# Patient Record
Sex: Female | Born: 1990 | Race: Black or African American | Hispanic: No | Marital: Single | Smoking: Never smoker
Health system: Southern US, Community
[De-identification: ages and names within clinical notes are randomized; demographics above are authoritative.]

## PROBLEM LIST (undated history)

## (undated) DIAGNOSIS — F419 Anxiety disorder, unspecified: Secondary | ICD-10-CM

## (undated) DIAGNOSIS — IMO0002 Reserved for concepts with insufficient information to code with codable children: Secondary | ICD-10-CM

## (undated) DIAGNOSIS — R42 Dizziness and giddiness: Secondary | ICD-10-CM

## (undated) DIAGNOSIS — F329 Major depressive disorder, single episode, unspecified: Secondary | ICD-10-CM

## (undated) DIAGNOSIS — K5792 Diverticulitis of intestine, part unspecified, without perforation or abscess without bleeding: Secondary | ICD-10-CM

## (undated) DIAGNOSIS — E079 Disorder of thyroid, unspecified: Secondary | ICD-10-CM

## (undated) DIAGNOSIS — Z8742 Personal history of other diseases of the female genital tract: Secondary | ICD-10-CM

## (undated) DIAGNOSIS — T7840XA Allergy, unspecified, initial encounter: Secondary | ICD-10-CM

## (undated) DIAGNOSIS — D649 Anemia, unspecified: Secondary | ICD-10-CM

## (undated) DIAGNOSIS — D259 Leiomyoma of uterus, unspecified: Secondary | ICD-10-CM

## (undated) DIAGNOSIS — R51 Headache: Secondary | ICD-10-CM

## (undated) DIAGNOSIS — E039 Hypothyroidism, unspecified: Secondary | ICD-10-CM

## (undated) DIAGNOSIS — S0300XA Dislocation of jaw, unspecified side, initial encounter: Secondary | ICD-10-CM

## (undated) DIAGNOSIS — D219 Benign neoplasm of connective and other soft tissue, unspecified: Secondary | ICD-10-CM

## (undated) DIAGNOSIS — F32A Depression, unspecified: Secondary | ICD-10-CM

## (undated) HISTORY — DX: Anxiety disorder, unspecified: F41.9

## (undated) HISTORY — PX: WISDOM TOOTH EXTRACTION: SHX21

## (undated) HISTORY — DX: Allergy, unspecified, initial encounter: T78.40XA

## (undated) HISTORY — DX: Headache: R51

## (undated) HISTORY — DX: Dizziness and giddiness: R42

## (undated) HISTORY — DX: Anemia, unspecified: D64.9

## (undated) HISTORY — DX: Dislocation of jaw, unspecified side, initial encounter: S03.00XA

## (undated) HISTORY — DX: Hypothyroidism, unspecified: E03.9

## (undated) HISTORY — DX: Personal history of other diseases of the female genital tract: Z87.42

## (undated) HISTORY — DX: Diverticulitis of intestine, part unspecified, without perforation or abscess without bleeding: K57.92

## (undated) HISTORY — DX: Leiomyoma of uterus, unspecified: D25.9

## (undated) HISTORY — DX: Reserved for concepts with insufficient information to code with codable children: IMO0002

## (undated) HISTORY — DX: Benign neoplasm of connective and other soft tissue, unspecified: D21.9

## (undated) HISTORY — PX: COLONOSCOPY: SHX174

---
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
## 2011-06-06 ENCOUNTER — Encounter: Payer: Self-pay | Admitting: Obstetrics and Gynecology

## 2011-10-26 ENCOUNTER — Encounter: Payer: Self-pay | Admitting: Obstetrics and Gynecology

## 2011-10-31 ENCOUNTER — Ambulatory Visit (INDEPENDENT_AMBULATORY_CARE_PROVIDER_SITE_OTHER): Payer: No Typology Code available for payment source | Admitting: Obstetrics and Gynecology

## 2011-10-31 ENCOUNTER — Encounter: Payer: Self-pay | Admitting: Obstetrics and Gynecology

## 2011-10-31 VITALS — BP 104/64 | Ht 65.0 in | Wt 163.0 lb

## 2011-10-31 DIAGNOSIS — E663 Overweight: Secondary | ICD-10-CM | POA: Insufficient documentation

## 2011-10-31 DIAGNOSIS — Z124 Encounter for screening for malignant neoplasm of cervix: Secondary | ICD-10-CM

## 2011-10-31 DIAGNOSIS — Z01419 Encounter for gynecological examination (general) (routine) without abnormal findings: Secondary | ICD-10-CM

## 2011-10-31 DIAGNOSIS — N920 Excessive and frequent menstruation with regular cycle: Secondary | ICD-10-CM

## 2011-10-31 MED ORDER — NORETHIN-ETH ESTRAD-FE BIPHAS 1 MG-10 MCG / 10 MCG PO TABS: 1.0000 | ORAL_TABLET | Freq: Every day | ORAL | Status: DC

## 2011-10-31 NOTE — Progress Notes (Signed)
Last Pap: 07/24/2007 WNL: Yes Regular Periods:no takes [facility] for cycle regulations Contraception: BC Pill  Monthly Breast exam:no Tetanus<30yrs:no Nl.Bladder Function:yes Daily BMs:no c/o constipation Healthy Diet:no Calcium:no Mammogram:no Date of Mammogram: n/a Exercise:no Have often Exercise: n/a Seatbelt: no Abuse at home: no Stressful work:no Sigmoid-colonoscopy: n/a Bone Density: No PCP: Dr. Durwin Nora Change in PMH: none Change in [facility]: none   ANNUAL GYNECOLOGIC EXAMINATION   Crystal Howell is a 21 y.o. female, G0P0, who presents for an annual exam. See above. The patient has a history of dysmenorrhea and menorrhagia.  She is better on birth control pills.  Her periods are slightly irregular.  She is not sexually active  Prior Hysterectomy: No    History   Social History  . Marital Status: Single    Spouse Name: N/A    Number of Children: N/A  . Years of Education: N/A   Social History Main Topics  . Smoking status: Never Smoker   . Smokeless tobacco: Never Used  . Alcohol Use: No  . Drug Use: No  . Sexually Active: Yes    Birth Control/ Protection: Pill   Other Topics Concern  . None   Social History Narrative  . None    Menstrual cycle:   LMP: Patient's last menstrual period was 09/20/2011.           Cycle: Regular, monthly with normal flow and no severe dysmenorrha  The following portions of the patient's history were reviewed and updated as appropriate: allergies, current medications, past family history, past medical history, past social history, past surgical history and problem list.  Review of Systems Pertinent items are noted in HPI. Breast:Negative for breast lump,nipple discharge or nipple retraction Gastrointestinal: Negative for abdominal pain, change in bowel habits or rectal bleeding Urinary:negative   Objective:    BP 104/64  Ht 5\' 5"  (1.651 m)  Wt 163 lb (73.936 kg)  BMI 27.12 kg/m2  LMP 09/20/2011    Weight:  Wt  Readings from Last 1 Encounters:  10/31/11 163 lb (73.936 kg)          BMI: Body mass index is 27.12 kg/(m^2).  General Appearance: Alert, appropriate appearance for age. No acute distress HEENT: Grossly normal Neck / Thyroid: Supple, no masses, nodes or enlargement Lungs: clear to auscultation bilaterally Back: No CVA tenderness Breast Exam: No masses or nodes.No dimpling, nipple retraction or discharge. Cardiovascular: Regular rate and rhythm. S1, S2, no murmur Gastrointestinal: Soft, non-tender, no masses or organomegaly  ++++++++++++++++++++++++++++++++++++++++++++++++++++++++  Pelvic Exam: External genitalia: normal general appearance Vaginal: normal without tenderness, induration or masses and relaxation: No Cervix: normal appearance Adnexa: normal bimanual exam Uterus: normal size, shape, and consistency Rectovaginal: not indicated  ++++++++++++++++++++++++++++++++++++++++++++++++++++++++  Lymphatic Exam: Non-palpable nodes in neck, clavicular, axillary, or inguinal regions Neurologic: Normal speech, no tremor  Psychiatric: Alert and oriented, appropriate affect.   Wet Prep:   not applicable Urinalysis:  not applicable UPT:           Not done   Assessment:    Normal gyn exam   Overweight or obese: Yes   Pelvic relaxation: No  Improved dysmenorrhea and menorrhagia on lolo Estrin OCPs.  Grieving her father's death   Plan:    pap smear return annually or prn Contraception:oral contraceptives (estrogen/progesterone)    Medications prescribed: LoLo Estrin  STD screen request: none  RPR: No.   HBsAg: No.  Hepatitis C: No.  The updated Pap smear screening guidelines were discussed with the patient. The patient  requested that I obtain a Pap smear: Yes.  Kegel exercises discussed: No.  Proper diet and regular exercise were reviewed.  Annual mammograms recommended starting at age 52. Proper breast care was discussed.  Regular health maintenance was  reviewed.  Mylinda Latina.D.

## 2011-11-05 LAB — PAP IG, CT-NG, RFX HPV ASCU: GC Probe Amp: NEGATIVE

## 2011-12-12 ENCOUNTER — Ambulatory Visit (INDEPENDENT_AMBULATORY_CARE_PROVIDER_SITE_OTHER): Payer: No Typology Code available for payment source | Admitting: Family Medicine

## 2011-12-12 VITALS — BP 108/64 | HR 72 | Temp 97.9°F | Resp 12 | Ht 66.0 in | Wt 164.0 lb

## 2011-12-12 DIAGNOSIS — R42 Dizziness and giddiness: Secondary | ICD-10-CM

## 2011-12-12 DIAGNOSIS — R51 Headache: Secondary | ICD-10-CM

## 2011-12-12 DIAGNOSIS — J309 Allergic rhinitis, unspecified: Secondary | ICD-10-CM | POA: Insufficient documentation

## 2011-12-12 LAB — COMPREHENSIVE METABOLIC PANEL
ALT: <8 U/L (ref 0–35)
AST: 14 U/L (ref 0–37)
Albumin: 4.6 g/dL (ref 3.5–5.2)
BUN: 9 mg/dL (ref 6–23)
Calcium: 9.5 mg/dL (ref 8.4–10.5)
Chloride: 107 mEq/L (ref 96–112)
Potassium: 4 mEq/L (ref 3.5–5.3)
Total Protein: 7.1 g/dL (ref 6.0–8.3)

## 2011-12-12 LAB — POCT CBC
Hemoglobin: 13.2 g/dL (ref 12.2–16.2)
MCH, POC: 29.8 pg (ref 27–31.2)
MPV: 8.3 fL (ref 0–99.8)
POC MID %: 6.3 %M (ref 0–12)
RBC: 4.43 M/uL (ref 4.04–5.48)
WBC: 4.7 10*3/uL (ref 4.6–10.2)

## 2011-12-12 MED ORDER — MECLIZINE HCL 25 MG PO TABS: ORAL_TABLET | ORAL | Status: DC

## 2011-12-12 NOTE — Progress Notes (Addendum)
Subjective: 21 year old lady with history of intermittent episodes of dizziness over the past several weeks. They may last from 10 minutes. She sometimes is lightheaded sometimes more spinning dizziness. She has not had any nausea or vomiting. No loss of coordination. Although she has headaches that is necessarily associated with the dizziness. She has had some mild episodes in the past, but this has been consistent run off these spells. She has them several times a day. She has not been taking medications for him.  Patient does have a history of allergic rhinitis for which she's on medicine. She's also oral contraceptives. 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 Healthy looking young lady. Her TMs are normal. Eyes PERRLA. Fundi benign. EOMs intact. Throat clear. Neck supple without nodes or thyromegaly. No carotid bruits. Chest clear. Heart regular murmurs. Finger to nose normal. Romberg negative. Tandem walk normal. Strength seems symmetrical.  Assessment: Vertigo  Plan: CBC, glucose, complete metabolic panel  Results for orders placed in visit on 12/12/11  POCT CBC      Component Value Range   WBC 4.7  4.6 - 10.2 K/uL   Lymph, poc 2.0  0.6 - 3.4   POC LYMPH PERCENT 42.0  10 - 50 %L   MID (cbc) 0.3  0 - 0.9   POC MID % 6.3  0 - 12 %M   POC Granulocyte 2.4  2 - 6.9   Granulocyte percent 51.7  37 - 80 %G   RBC 4.43  4.04 - 5.48 M/uL   Hemoglobin 13.2  12.2 - 16.2 g/dL   HCT, POC 78.2  95.6 - 47.9 %   MCV 96.4  80 - 97 fL   MCH, POC 29.8  27 - 31.2 pg   MCHC 30.9 (*) 31.8 - 35.4 g/dL   RDW, POC 21.3     Platelet Count, POC 304  142 - 424 K/uL   MPV 8.3  0 - 99.8 fL  GLUCOSE, POCT (MANUAL RESULT ENTRY)      Component Value Range   POC Glucose 73  70 - 99 mg/dl    Will treat with Antivert. Return if worse.  Patient incidentally added that she sometimes wakes up and her heart pounding hard. Advised her that if this continues to persist  she would need to come back in to be further assessed.

## 2011-12-12 NOTE — Patient Instructions (Addendum)
Vertigo Vertigo means you feel like you or your surroundings are moving when they are not. Vertigo can be dangerous if it occurs when you are at work, driving, or performing difficult activities.  CAUSES  Vertigo occurs when there is a conflict of signals sent to your brain from the visual and sensory systems in your body. There are many different causes of vertigo, including:  Infections, especially in the inner ear.   A bad reaction to a drug or misuse of alcohol and medicines.   Withdrawal from drugs or alcohol.   Rapidly changing positions, such as lying down or rolling over in bed.   A migraine headache.   Decreased blood flow to the brain.   Increased pressure in the brain from a head injury, infection, tumor, or bleeding.  SYMPTOMS  You may feel as though the world is spinning around or you are falling to the ground. Because your balance is upset, vertigo can cause nausea and vomiting. You may have involuntary eye movements (nystagmus). DIAGNOSIS  Vertigo is usually diagnosed by physical exam. If the cause of your vertigo is unknown, your caregiver may perform imaging tests, such as an MRI scan (magnetic resonance imaging). TREATMENT  Most cases of vertigo resolve on their own, without treatment. Depending on the cause, your caregiver may prescribe certain medicines. If your vertigo is related to body position issues, your caregiver may recommend movements or procedures to correct the problem. In rare cases, if your vertigo is caused by certain inner ear problems, you may need surgery. HOME CARE INSTRUCTIONS   Follow your caregiver's instructions.   Avoid driving.   Avoid operating heavy machinery.   Avoid performing any tasks that would be dangerous to you or others during a vertigo episode.   Tell your caregiver if you notice that certain medicines seem to be causing your vertigo. Some of the medicines used to treat vertigo episodes can actually make them worse in some  people.  SEEK IMMEDIATE MEDICAL CARE IF:   Your medicines do not relieve your vertigo or are making it worse.   You develop problems with talking, walking, weakness, or using your arms, hands, or legs.   You develop severe headaches.   Your nausea or vomiting continues or gets worse.   You develop visual changes.   A family member notices behavioral changes.   Your condition gets worse.  MAKE SURE YOU:  Understand these instructions.   Will watch your condition.   Will get help right away if you are not doing well or get worse.  Document Released: 01/03/2005 Document Revised: 03/15/2011 Document Reviewed: 10/12/2010 [facility] Patient Information 2012 [place], [place].   Take the antivert medicine when needed for dizzines

## 2011-12-12 NOTE — Addendum Note (Signed)
Addended by: HOPPER, DAVID H on: 12/12/2011 09:39 AM   Modules accepted: Level of Service

## 2011-12-14 NOTE — Progress Notes (Signed)
Quick Note:  Send patient a normal lab letter ______

## 2011-12-15 ENCOUNTER — Encounter: Payer: Self-pay | Admitting: Radiology

## 2012-04-07 ENCOUNTER — Telehealth: Payer: Self-pay | Admitting: Obstetrics and Gynecology

## 2012-04-07 NOTE — Telephone Encounter (Signed)
Tc to pt regarding message. Pt states she is on lo loestrin and has not had a cycle. Asked pt did she do a pregnancy test and pt states no. Pt want to come in to see AVS. Scheduled pt an appt with AVS on this week. Pt voiced understanding.

## 2012-04-07 NOTE — Telephone Encounter (Signed)
Lm for pt to call back

## 2012-04-16 ENCOUNTER — Ambulatory Visit (INDEPENDENT_AMBULATORY_CARE_PROVIDER_SITE_OTHER): Payer: No Typology Code available for payment source | Admitting: Obstetrics and Gynecology

## 2012-04-16 ENCOUNTER — Encounter: Payer: Self-pay | Admitting: Obstetrics and Gynecology

## 2012-04-16 VITALS — BP 102/56 | HR 74 | Wt 179.0 lb

## 2012-04-16 DIAGNOSIS — R300 Dysuria: Secondary | ICD-10-CM

## 2012-04-16 DIAGNOSIS — N912 Amenorrhea, unspecified: Secondary | ICD-10-CM

## 2012-04-16 LAB — POCT URINALYSIS DIPSTICK
Bilirubin, UA: NEGATIVE
Glucose, UA: NEGATIVE
Leukocytes, UA: NEGATIVE
Nitrite, UA: NEGATIVE
Urobilinogen, UA: NEGATIVE

## 2012-04-16 NOTE — Progress Notes (Signed)
Subjective:    Crystal Howell is a 22 y.o. female, G0P0, who presents for been on lo-lo loesrtin has not had a cycle this month. She also complains of dysuria in the morning. She is not sexually active.  The following portions of the patient's history were reviewed and updated as appropriate: allergies, current medications, past family history.  Review of Systems Pertinent items are noted in HPI. Breast:Negative for breast lump,nipple discharge or nipple retraction Gastrointestinal: Negative for abdominal pain, change in bowel habits or rectal bleeding Urinary:negative   Objective:    BP 102/56  Pulse 74  Wt 179 lb (81.194 kg)  LMP 03/06/2012    Weight:  Wt Readings from Last 1 Encounters:  04/16/12 179 lb (81.194 kg)          BMI: There is no height on file to calculate BMI.  General Appearance: Alert, appropriate appearance for age. No acute distress BAck: No CVA tenderness Gastrointestinal: Soft, non-tender, no masses or organomegaly Pelvic Exam: External genitalia: normal general appearance Vaginal: normal without tenderness, induration or masses Cervix: nontender Adnexa: normal bimanual exam Uterus: normal size, nontender Rectovaginal: not indicated  UA: negative    Assessment:   Amenorrhea secondary to birth control pills  Dysuria   Plan:    A long discussion was had with the patient about birth control and amenorrhea. The patient is not sexually active. She elects to continue Lo Loestrin for now.  We will have the patient increase her fluid intake as well as cranberry juice. If her dysuria continues then she will return for urine culture.  Return for annual exam.  Dr. Stefano Gaul

## 2012-10-20 ENCOUNTER — Ambulatory Visit (INDEPENDENT_AMBULATORY_CARE_PROVIDER_SITE_OTHER): Payer: No Typology Code available for payment source | Admitting: Family Medicine

## 2012-10-20 ENCOUNTER — Telehealth: Payer: Self-pay | Admitting: Radiology

## 2012-10-20 VITALS — BP 111/73 | HR 70 | Temp 97.8°F | Resp 16 | Ht 65.0 in | Wt 179.0 lb

## 2012-10-20 DIAGNOSIS — G935 Compression of brain: Secondary | ICD-10-CM

## 2012-10-20 DIAGNOSIS — R51 Headache: Secondary | ICD-10-CM

## 2012-10-20 DIAGNOSIS — R209 Unspecified disturbances of skin sensation: Secondary | ICD-10-CM

## 2012-10-20 LAB — POCT URINE PREGNANCY: Preg Test, Ur: NEGATIVE

## 2012-10-20 NOTE — Telephone Encounter (Signed)
I spoke to [place] at [facility] no precertication or notification is required for the MRI scan. I will call tomorrow to get this scheduled at [facility].

## 2012-10-20 NOTE — Telephone Encounter (Signed)
I need to call tomorrow about the scan, will get it at [facility], she had previous one there which shows a Chiari malformation, this was in 2010. Sandip Power

## 2012-10-20 NOTE — Progress Notes (Signed)
[facility] [address] [place] [address] [id number] [phone]  Date:  10/20/2012   Name:  Crystal Howell   DOB:  [birth year]   MRN:  [id number]  PCP:  Carver Fila, RN    Chief Complaint: Numbness and Headache   History of Present Illness:  Crystal Howell is a 22 y.o. very pleasant female patient who presents with the following:  She is here with numbness and weakness in her right hand since yesterday- she first noted it around noon yesterday.  She dropped a cup of water from her right hand yesterday.  The sx seem to come and go, but "has been pretty persistent."  She notes she has had this once in the past- a few months ago. It resolved.    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 She mainly notes the sx in her right index and long fingers.   The left hand is ok.    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  The MRI was ordered by her ophthalmologist, and she saw a neurologist but cannot recall his name at this time.  She also saw Dr. Mikal Plane- she was told that her malformation would be unlikely to cause her any problems.  She has not followed up since.  Also, she notes that she had a HA on Friday- it felt like "a bulging" in her right temple area.  This lasted about an hour, and resolved Friday evening. She no longer has a HA.    LMP was 09/18/12.  She is SA, uses OCP and condoms.   She is otherwise generally healthy    Patient Active Problem List   Diagnosis Date Noted  . Allergic rhinitis 12/12/2011  . Overweight 10/31/2011    Past Medical History  Diagnosis Date  . Hx of menorrhagia   . Allergy   . Anemia     Past Surgical History  Procedure Laterality Date  . Wisdom tooth extraction      History  Substance Use Topics  . Smoking status: Never Smoker   . Smokeless tobacco: Never Used  . Alcohol Use: No    Family History  Problem Relation  Age of Onset  . Thyroid disease Mother   . Anemia Mother   . Heart disease Maternal Grandmother   . Emphysema Maternal Grandmother   . Stroke Maternal Grandmother   . Mental illness Maternal Grandmother   . Lung cancer Maternal Grandfather   . Liver disease Father     Allergies  Allergen Reactions  . Doxycycline   . Erythromycin     Medication list has been reviewed and updated.  Current Outpatient Prescriptions on File Prior to Visit  Medication Sig Dispense Refill  . albuterol (PROVENTIL HFA;VENTOLIN HFA) 108 (90 BASE) MCG/ACT inhaler Inhale 2 puffs into the lungs every 6 (six) hours as needed.      . loratadine (CLARITIN) 10 MG tablet Take 10 mg by mouth daily.      . Norethindrone-Ethinyl Estradiol-Fe Biphas (LO LOESTRIN FE) 1 MG-10 MCG / 10 MCG tablet Take 1 tablet by mouth daily.  1 Package  11  . fluticasone (FLONASE) 50 MCG/ACT nasal spray Place 2 sprays into the nose daily.      . meclizine (ANTIVERT) 25 MG tablet Take one every 6 hours as needed for dizziness  30 tablet  1  .  norethindrone-ethinyl estradiol (JUNEL FE,GILDESS FE,LOESTRIN FE) 1-20 MG-MCG tablet Take 1 tablet by mouth daily.       No current facility-administered medications on file prior to visit.    Review of Systems:  As per HPI- otherwise negative.   Physical Examination: Filed Vitals:   10/20/12 0919  BP: 111/73  Pulse: 70  Temp: 97.8 F (36.6 C)  Resp: 16   Filed Vitals:   10/20/12 0919  Height: 5\' 5"  (1.651 m)  Weight: 179 lb (81.194 kg)   Body mass index is 29.79 kg/(m^2). Ideal Body Weight: Weight in (lb) to have BMI = 25: 149.9  GEN: WDWN, NAD, Non-toxic, A & O x 3, looks well HEENT: Atraumatic, Normocephalic. Neck supple. No masses, No LAD.  Bilateral TM wnl, oropharynx normal.  PEERL,EOMI.  No bruit.   Ears and Nose: No external deformity. CV: RRR, No M/G/R. No JVD. No thrill. No extra heart sounds. PULM: CTA B, no wheezes, crackles, rhonchi. No retractions. No resp.  distress. No accessory muscle use. ABD: S, NT, ND, +BS. No rebound. No HSM. EXTR: No c/c/e NEURO Normal gait. Normal neuro exam except as below- normal strength and DTR all extremities, normal facial movement. Negative romberg and tandem gait testing.  She does not note numbness in the right hand- this is present in the long and index fingers.  PSYCH: Normally interactive. Conversant. Not depressed or anxious appearing.  Calm demeanor.  Positive phalen test right hand  Requested old MRI scan- actually was done in 2010, performed at [facility].  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    Results for orders placed in visit on 10/20/12  POCT URINE PREGNANCY      Result Value Range   Preg Test, Ur Negative      Assessment and Plan: Headache(784.0) - Plan: POCT urine pregnancy  Chiari malformation type I - Plan: MR Brain Wo Contrast   Headache has now resolved.  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  Placed in a velcro wrist splint.   Allowed her to go home while I contacted NSG.  Her regular doctor is on vacation, but I was able to speak with the doctor on call.  As she has not had any follow- up scan so far, would be prudent to perform an MRI to look for any change in her Chiari malformation.  Discussed this with [place].  Also discussed the unlikely possibility that her hand numbness could represent something more acute than CTS, such as a CVA.  I am unfortunately unable to get her MRI as an outpt scheduled this evening.  Offered to have her go to the ED for more urgent evaluation vs scheduling her MRI tomorrow.  She choose to have the MRI tomorrow, and we will plan to have this done at [facility].  If any acute change overnight she will go to the ED.    Signed Abbe Amsterdam, MD

## 2012-10-20 NOTE — Patient Instructions (Addendum)

## 2012-10-21 ENCOUNTER — Telehealth: Payer: Self-pay | Admitting: Family Medicine

## 2012-10-21 NOTE — Telephone Encounter (Signed)
Called [facility]. Patient can go today for the scan 11:45am for 12:15 appt time.  Patient advised and will go for the scan. Order faxed to [phone] and [phone]. To you FYI. I have asked for a call report.

## 2012-10-21 NOTE — Telephone Encounter (Signed)
Received verbal report from radiologist at [facility]- no acute changes, Chiari malformation is stable.  Asked her to please wear her splint at night for a couple of weeks for CTS- let me know if not helpful.  Also, let me know if any other unusual headaches.

## 2012-10-29 ENCOUNTER — Telehealth: Payer: Self-pay | Admitting: Family Medicine

## 2012-10-29 NOTE — Telephone Encounter (Signed)
Called to check on her- she is doing well.  Her hand is feeling much better. Received full MRI report.  Per her MRI her Chiari malformation is unchanged.  It is still 12 mm in size.   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  This was not completley seen on her MRI scan. I will mail her a copy of her MRI scans.  She should talk this over with her PCP and they can order an ultrasound if she likes.  However, these are probably benign.    Will mail pt a copy of both MRI scans.

## 2012-11-18 ENCOUNTER — Encounter: Payer: Self-pay | Admitting: Family Medicine

## 2013-01-31 ENCOUNTER — Emergency Department (HOSPITAL_COMMUNITY)
Admission: EM | Admit: 2013-01-31 | Discharge: 2013-01-31 | Disposition: A | Discharge status: Home / Self Care | Payer: No Typology Code available for payment source | Source: Home / Self Care | Attending: Emergency Medicine | Admitting: Emergency Medicine

## 2013-01-31 ENCOUNTER — Telehealth: Payer: Self-pay

## 2013-01-31 ENCOUNTER — Encounter (HOSPITAL_COMMUNITY): Payer: Self-pay | Admitting: Emergency Medicine

## 2013-01-31 DIAGNOSIS — G8929 Other chronic pain: Secondary | ICD-10-CM

## 2013-01-31 DIAGNOSIS — R519 Headache, unspecified: Secondary | ICD-10-CM

## 2013-01-31 DIAGNOSIS — R51 Headache: Secondary | ICD-10-CM

## 2013-01-31 DIAGNOSIS — Q054 Unspecified spina bifida with hydrocephalus: Secondary | ICD-10-CM

## 2013-01-31 DIAGNOSIS — IMO0002 Reserved for concepts with insufficient information to code with codable children: Secondary | ICD-10-CM

## 2013-01-31 MED ORDER — GABAPENTIN 300 MG PO CAPS: 300.0000 mg | ORAL_CAPSULE | Freq: Three times a day (TID) | ORAL | Status: DC | PRN

## 2013-01-31 NOTE — ED Notes (Signed)
Pt c/o headache onset yest  Sxs include: dizziness, lightheaded, loss of balance Hx of vertigo... Takes meclizine but las had it last week Denies: f/v/n/d She is alert w/no signs of acute distress.

## 2013-01-31 NOTE — Telephone Encounter (Signed)
[place] - Pt's mother called about her headaches returning.  She says this was discussed in the past and wanted to know if you could send her out for a referral.  Please call [phone]

## 2013-01-31 NOTE — ED Provider Notes (Signed)
CSN: [id number]     Arrival date & time 01/31/13  1803 History   First MD Initiated Contact with Patient 01/31/13 1816     Chief Complaint  Patient presents with  . Headache   (Consider location/radiation/quality/duration/timing/severity/associated sxs/prior Treatment) HPI Comments: 22 year old female presents complaining of headache. She has headache diffuse across her head but worse in the back of her head that is constant and throbbing in nature. It has been associated with some mild lightheadedness that has resolved. She also has some numbness in her right hand has been going on for some time now. This headache in particular it has been present since yesterday. She denies nausea, vomiting, fever, chills, neck stiffness, rash. She has a history of Chiari malformation so she wanted to just get checked out to make sure that everything is okay. She does not currently have a neurologist.    Past Medical History  Diagnosis Date  . Hx of menorrhagia   . Allergy   . Anemia    Past Surgical History  Procedure Laterality Date  . Wisdom tooth extraction     Family History  Problem Relation Age of Onset  . Thyroid disease Mother   . Anemia Mother   . Heart disease Maternal Grandmother   . Emphysema Maternal Grandmother   . Stroke Maternal Grandmother   . Mental illness Maternal Grandmother   . Lung cancer Maternal Grandfather   . Liver disease Father    History  Substance Use Topics  . Smoking status: Never Smoker   . Smokeless tobacco: Never Used  . Alcohol Use: No   OB History   Grav Para Term Preterm Abortions TAB SAB Ect Mult Living   0              Review of Systems  Constitutional: Negative for fever and chills.  Eyes: Negative for visual disturbance.  Respiratory: Negative for cough and shortness of breath.   Cardiovascular: Negative for chest pain, palpitations and leg swelling.  Gastrointestinal: Negative for nausea, vomiting and abdominal pain.  Endocrine:  Negative for polydipsia and polyuria.  Genitourinary: Negative for dysuria, urgency and frequency.  Musculoskeletal: Negative for arthralgias and myalgias.  Skin: Negative for rash.  Neurological: Positive for dizziness, light-headedness and headaches. Negative for weakness.    Allergies  Doxycycline and Erythromycin  Home Medications   Current Outpatient Rx  Name  Route  Sig  Dispense  Refill  . loratadine (CLARITIN) 10 MG tablet   Oral   Take 10 mg by mouth daily.         . norethindrone-ethinyl estradiol (JUNEL FE,GILDESS FE,LOESTRIN FE) 1-20 MG-MCG tablet   Oral   Take 1 tablet by mouth daily.         Marland Kitchen albuterol (PROVENTIL HFA;VENTOLIN HFA) 108 (90 BASE) MCG/ACT inhaler   Inhalation   Inhale 2 puffs into the lungs every 6 (six) hours as needed.         . fluticasone (FLONASE) 50 MCG/ACT nasal spray   Nasal   Place 2 sprays into the nose daily.         Marland Kitchen gabapentin (NEURONTIN) 300 MG capsule   Oral   Take 1 capsule (300 mg total) by mouth 3 (three) times daily as needed (for headaches).   20 capsule   0   . meclizine (ANTIVERT) 25 MG tablet      Take one every 6 hours as needed for dizziness   30 tablet   1   . EXPIRED: Norethindrone-Ethinyl  Estradiol-Fe Biphas (LO LOESTRIN FE) 1 MG-10 MCG / 10 MCG tablet   Oral   Take 1 tablet by mouth daily.   1 Package   11    BP 109/77  Pulse 76  Temp(Src) 98.1 F (36.7 C) (Oral)  Resp 18  SpO2 100%  LMP 01/02/2013 Physical Exam  Nursing note and vitals reviewed. Constitutional: She is oriented to person, place, and time. Vital signs are normal. She appears well-developed and well-nourished. No distress.  HENT:  Head: Normocephalic and atraumatic.  Pulmonary/Chest: Effort normal. No respiratory distress.  Neurological: She is alert and oriented to person, place, and time. She has normal strength and normal reflexes. She displays normal reflexes. No cranial nerve deficit. She exhibits normal muscle tone.  Coordination normal.  Skin: Skin is warm and dry. No rash noted. She is not diaphoretic.  Psychiatric: She has a normal mood and affect. Judgment normal.    ED Course  Procedures (including critical care time) Labs Review Labs Reviewed - No data to display Imaging Review No results found.    MDM   1. Headache   2. Chiari malformation    Neurologic exam is completely normal at this time. Discussed case with attending, Neurontin can be helpful for headaches in the setting of Chiari malformation due to possible occipital neuralgia. Referral provided for neuro, she needs for follow up.  F/u as needed for this condition if not improving.   Meds ordered this encounter  Medications  . gabapentin (NEURONTIN) 300 MG capsule    Sig: Take 1 capsule (300 mg total) by mouth 3 (three) times daily as needed (for headaches).    Dispense:  20 capsule    Refill:  0    Order Specific Question:  Supervising Provider    Answer:  Lorenz Coaster, DAVID C [[id number]]     Graylon Good, PA-C 02/01/13 1456

## 2013-02-01 NOTE — Telephone Encounter (Signed)
Called [place] back and [facility]- I assume she needs a neurology referral.  I will get this started- if she needs something else please let me know

## 2013-02-01 NOTE — Telephone Encounter (Signed)
Patient with history of chiari malformation, please advise, she did go to the ER on 10/25

## 2013-02-02 NOTE — ED Provider Notes (Signed)
Medical screening examination/treatment/procedure(s) were performed by non-physician practitioner and as supervising physician I was immediately available for consultation/collaboration.  Leslee Home, M.D.  Reuben Likes, MD 02/02/13 0800

## 2013-02-03 ENCOUNTER — Encounter: Payer: Self-pay | Admitting: Neurology

## 2013-02-03 ENCOUNTER — Ambulatory Visit (INDEPENDENT_AMBULATORY_CARE_PROVIDER_SITE_OTHER): Payer: No Typology Code available for payment source | Admitting: Neurology

## 2013-02-03 VITALS — BP 95/66 | HR 75 | Temp 98.4°F | Ht 64.5 in | Wt 179.0 lb

## 2013-02-03 DIAGNOSIS — Z87898 Personal history of other specified conditions: Secondary | ICD-10-CM

## 2013-02-03 DIAGNOSIS — R42 Dizziness and giddiness: Secondary | ICD-10-CM

## 2013-02-03 DIAGNOSIS — H538 Other visual disturbances: Secondary | ICD-10-CM

## 2013-02-03 DIAGNOSIS — Z8669 Personal history of other diseases of the nervous system and sense organs: Secondary | ICD-10-CM

## 2013-02-03 DIAGNOSIS — G935 Compression of brain: Secondary | ICD-10-CM

## 2013-02-03 DIAGNOSIS — R51 Headache: Secondary | ICD-10-CM

## 2013-02-03 NOTE — Progress Notes (Signed)
Subjective:    Patient ID: Crystal Howell is a 22 y.o. female.  HPI  Huston Foley, MD, PhD [facility] [address] [address] [place], [place] [address]  Dear Dr. Patsy Lager,   I saw your patient, Crystal Howell, upon your kind request in my neurologic clinic today for initial consultation of her migraine headaches. The patient is unaccompanied today. As you know, Crystal Howell is a very pleasant 22 year old right-handed woman with an underlying medical history of allergy, anemia, menorrhagia, who has had recurrent headaches since 2010. She has a history of Chiari malformation, first seen on a brain MRI in 2010. She had an MRI cervical spine as well as an MRI brain with and without contrast all done on 11/18/2012 and I reviewed the tests on CD as well as the report. 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 No enhancement was seen, no other structural or cord abnormalities were seen, no significant degenerative changes of the spine were seen. She was seen at [facility] on 01/31/13 with a HA in the R posterior region and numbness in her head. She was started on Neurontin, which she took then, but not since then, as it made her sleepy and she had resolution of her Sx. She has no permanent numbness, tingling or weakness. Her HA frequency is erratic and perhaps up to 1/week. There is no FHx of Chiari malformation. Her pain level may go up to 7/10.  There is family history of migraine in her mother. She has had vertigo and Meclizine has helped for this, but she also has intermittent off balance Sx, and she does not describe vertigo with that. Meclizine makes her sleepy too. Her HA typically is not associated with N/V, visual aura, photophobia and feels like a tightness sensation. She has had no recent eye exam and has had intermittent blurry vision.  Treatments tried include Advil, which does not always help. She has not tried any other prescription medicine since Sx  started in 2010. The patient denies prior TIA or stroke symptoms, such as sudden onset of one sided weakness, numbness, tingling, slurring of speech or droopy face, hearing loss, tinnitus, diplopia or visual field cut or monocular loss of vision, and denies daily headaches.  Of note, the patient denies snoring.  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   Her Past Medical History Is Significant For: Past Medical History  Diagnosis Date  . Hx of menorrhagia   . Allergy   . Anemia   . Chiari malformation   . Headache(784.0)    Her Past Surgical History Is Significant For: Past Surgical History  Procedure Laterality Date  . Wisdom tooth extraction      Her Family History Is Significant For: Family History  Problem Relation Age of Onset  . Thyroid disease Mother   . Anemia Mother   . Heart disease Maternal Grandmother   . Emphysema Maternal Grandmother   . Stroke Maternal Grandmother   . Mental illness Maternal Grandmother   . Lung cancer Maternal Grandfather   . Liver disease Father     Her Social History Is Significant For: History   Social History  . Marital Status: Single    Spouse Name: N/A    Number of Children: N/A  . Years of Education: N/A   Social History Main Topics  . Smoking status: Never Smoker   . Smokeless tobacco: Never Used  . Alcohol Use: No  . Drug Use: No  .  Sexual Activity: Yes    Birth Control/ Protection: Pill   Other Topics Concern  . None   Social History Narrative  . None    Her Allergies Are:  Allergies  Allergen Reactions  . Doxycycline   . Erythromycin   :   Her Current Medications Are:  Outpatient Encounter Prescriptions as of 02/03/2013  Medication Sig Dispense Refill  . albuterol (PROVENTIL HFA;VENTOLIN HFA) 108 (90 BASE) MCG/ACT inhaler Inhale 2 puffs into the lungs every 6 (six) hours as needed.      . fluticasone (FLONASE) 50 MCG/ACT nasal spray Place 2 sprays into the nose daily.      Marland Kitchen  gabapentin (NEURONTIN) 300 MG capsule Take 1 capsule (300 mg total) by mouth 3 (three) times daily as needed (for headaches).  20 capsule  0  . loratadine (CLARITIN) 10 MG tablet Take 10 mg by mouth daily.      . meclizine (ANTIVERT) 25 MG tablet Take one every 6 hours as needed for dizziness  30 tablet  1  . norethindrone-ethinyl estradiol (JUNEL FE,GILDESS FE,LOESTRIN FE) 1-20 MG-MCG tablet Take 1 tablet by mouth daily.      . Norethindrone-Ethinyl Estradiol-Fe Biphas (LO LOESTRIN FE) 1 MG-10 MCG / 10 MCG tablet Take 1 tablet by mouth daily.      . [DISCONTINUED] Norethindrone-Ethinyl Estradiol-Fe Biphas (LO LOESTRIN FE) 1 MG-10 MCG / 10 MCG tablet Take 1 tablet by mouth daily.  1 Package  11   No facility-administered encounter medications on file as of 02/03/2013.  :  Review of Systems:  Out of a complete 14 point review of systems, all are reviewed and negative with the exception of these symptoms as listed below:    Review of Systems  Constitutional: Positive for fatigue.  Eyes: Positive for visual disturbance ( blurred vision).  Respiratory: Negative.   Cardiovascular: Negative.   Gastrointestinal: Negative.   Endocrine: Negative.   Genitourinary: Negative.   Musculoskeletal: Negative.   Skin: Negative.   Allergic/Immunologic: Positive for environmental allergies.  Neurological: Positive for numbness and headaches.  Hematological: Negative.   Psychiatric/Behavioral: Negative.     Objective:  Neurologic Exam  Physical Exam Physical Examination:   Filed Vitals:   02/03/13 0948  BP: 95/66  Pulse: 75  Temp: 98.4 F (36.9 C)    General Examination: The patient is a very pleasant 22 y.o. female in no acute distress. She appears well-developed and well-nourished and well groomed.   HEENT: Normocephalic, atraumatic, pupils are equal, round and reactive to light and accommodation. Funduscopic exam is normal with sharp disc margins noted. VFF by finger perimetry.  Extraocular tracking is good without limitation to gaze excursion or nystagmus noted. Normal smooth pursuit is noted. Hearing is grossly intact. Tympanic membranes are clear bilaterally. Face is symmetric with normal facial animation and normal facial sensation. Speech is clear with no dysarthria noted. There is no hypophonia. There is no lip, neck/head, jaw or voice tremor. Neck is supple with full range of passive and active motion. There are no carotid bruits on auscultation. Oropharynx exam reveals: mild mouth dryness, good dental hygiene and mild airway crowding, due to elongated tongue. Mallampati is class II. Tongue protrudes centrally and palate elevates symmetrically.   Chest: Clear to auscultation without wheezing, rhonchi or crackles noted.  Heart: S1+S2+0, regular and normal without murmurs, rubs or gallops noted.   Abdomen: Soft, non-tender and non-distended with normal bowel sounds appreciated on auscultation.  Extremities: There is no pitting edema in the distal lower  extremities bilaterally. Pedal pulses are intact.  Skin: Warm and dry without trophic changes noted. There are no varicose veins.  Musculoskeletal: exam reveals no obvious joint deformities, tenderness or joint swelling or erythema.   Neurologically:  Mental status: The patient is awake, alert and oriented in all 4 spheres. Her memory, attention, language and knowledge are appropriate. There is no aphasia, agnosia, apraxia or anomia. Speech is clear with normal prosody and enunciation. Thought process is linear. Mood is congruent and affect is normal.  Cranial nerves are as described above under HEENT exam. In addition, shoulder shrug is normal with equal shoulder height noted. Motor exam: Normal bulk, strength and tone is noted. There is no drift, tremor or rebound. Romberg is negative. Reflexes are 2+ throughout. Toes are downgoing bilaterally. Fine motor skills are intact with normal finger taps, normal hand  movements, normal rapid alternating patting, normal foot taps and normal foot agility.  Cerebellar testing shows no dysmetria or intention tremor on finger to nose testing. Heel to shin is unremarkable bilaterally. There is no truncal or gait ataxia.  Sensory exam is intact to light touch, pinprick, vibration, temperature sense and proprioception in the upper and lower extremities.  Gait, station and balance are unremarkable. No veering to one side is noted. No leaning to one side is noted. Posture is age-appropriate and stance is narrow based. No problems turning are noted. She turns en bloc. Tandem walk is unremarkable. Intact toe and heel stance is noted.               Assessment and Plan:   In summary, Crystal Howell is a very pleasant 22 y.o.-year old female with an underlying medical history of allergies, whose history and exam are in keeping with a diagnosis of headaches, in the context of chiar I malformation. The description of her headaches as non-migrainous. Therefore, I did not suggest her changing her estrogen based oral contraceptive pill. Furthermore, she has had intermittent vertigo, dizziness, blurry vision. Her symptoms may very well be related to the underlying CM. Thankfully, she has infrequent Sx and she has had some success with as needed use of meclizine and gabapentin. Her exam is non-focal today and I reassured the patient in that regard. I reviewed her MRI scans as well as the reports. I had a long chat with the patient about my findings and the diagnosis of Chiari malformation, the prognosis and treatment options. We talked about medical treatments and non-pharmacological approaches. We talked about headache triggers in general. I encouraged the patient to eat healthy, exercise daily and keep well hydrated, to keep a scheduled bedtime and wake time routine, to not skip any meals and eat healthy snacks in between meals and to have protein with every meal.   I advised the  patient about common headache triggers: sleep deprivation, dehydration, overheating, stress, hypoglycemia or skipping meals and blood sugar fluctuations, excessive pain medications or excessive alcohol use or caffeine withdrawal. Some people have food triggers such as aged cheese, orange juice or chocolate, especially dark chocolate, or MSG (monosodium glutamate). She is advised that there is no specific medication that is used to treat Chiari malformation and related symptoms. Symptomatic treatment is individualized and people try a different things before they find something that may help. Ultimately, surgery may be needed in some cases. I do not believe that we need to pursue surgical consultation at this time and advised her of this. At this juncture, I do not think we need to add  any further diagnostic testing. I advised her that she continue with as needed use of meclizine and gabapentin. Since her symptoms are infrequent and she looks well, we can play it by ear and I can see her back on an as-needed basis. She was in agreement. Thank you very much for allowing me to participate in the care of this nice patient. If I can be of any further assistance to you please do not hesitate to call me at [phone].  Sincerely,   Huston Foley, MD, PhD

## 2013-02-03 NOTE — Patient Instructions (Addendum)
Please remember, common headache triggers are: sleep deprivation, dehydration, overheating, stress, hypoglycemia or skipping meals and blood sugar fluctuations, excessive pain medications or excessive alcohol use or caffeine withdrawal. Some people have food triggers such as aged cheese, orange juice or chocolate, especially dark chocolate, or MSG (monosodium glutamate). Try to avoid these headache triggers as much possible. It may be helpful to keep a headache diary to figure out what makes your headaches worse or brings them on and what alleviates them. Some people report headache onset after exercise but studies have shown that regular exercise may actually prevent headaches from coming. If you have exercise-induced headaches, please make sure that you drink plenty of fluid before and after exercising and that you do not over do it and do not overheat.  You have a condition called Chiari malformation (CM), which is a congenital malformation (i.e. something you were born with), in which the bottom of the brain (cerebellum) is crowded in the skull cavity, and forces the lower tips of the cerebellar hemi-spheres (the so called cerebellar tonsils) into the hole in the bottom of the skull (foramen magnum). This can cause blockage of cerebrospinal fluid (CSF) flow from the cranial cavity into the spinal canal. CSF is the clear liquid that surrounds and cushions the brain and spinal cord and normally flows freely to and from the brain.   Type I involves the extension of the cerebellar tonsils (the lower part of the cerebellum) into the foramen magnum, without involving the brain stem. Normally, only the spinal cord passes through this opening. Type I-which may not cause symptoms-is the most common form of CM and is usually first noticed in adolescence or adulthood, often by accident during an examination for another condition. Type I is the only type of CM that can be acquired, but that is less common than the  congenital form.   Symptoms may include neck pain, balance problems, muscle weakness, numbness or other abnormal feelings in the arms or legs, dizziness, vision problems, difficulty swallowing, ringing or buzzing in the ears, hearing loss, vomiting, insomnia, depression, or headache made worse by coughing or straining. Hand coordination and fine motor skills may be affected. Symptoms may change for some individuals, depending on the buildup of CSF and resulting pressure on the tissues and nerves. Persons with a Type I CM may not have symptoms. Adolescents and adults who have CM but no symptoms initially may, later in life, develop signs of the disorder.   There is no specific medical treatment for Chiari malformation, however there are surgical procedures that do exist and may be needed for patients who have severe symptoms. I don't believe we need to pursue surgical consultation at this time. Some CMs are asymptomatic and do not interfere with a person's activities of daily living. In other cases, medications may ease certain symptoms, such as pain. You can continue using meclizine and gabapentin as needed.   Surgery is the only treatment available to correct functional disturbances or halt the progression of damage to the central nervous system. Most individuals who have surgery see a reduction in their symptoms and/or prolonged periods of relative stability. More than one surgery may be needed to treat the condition.  Posterior fossa decompression surgery is performed on adults with CM to create more space for the cerebellum and to relieve pressure on the spinal column. Surgery involves making an incision at the back of the head and removing a small portion of the bottom of the skull (and sometimes part  of the spinal column) to correct the irregular bony structure. The neurosurgeon may use a procedure called electrocautery to shrink the cerebellar tonsils. This surgical technique involves destroying tissue  with high-frequency electrical currents.  A related procedure, called a spinal laminectomy, involves the surgical removal of part of the arched, bony roof of the spinal canal (the lamina) to increase the size of the spinal canal and relieve pressure on the spinal cord and nerve roots.   Please see your eye doctor as it has been over a year since your last eye exam. I will see you back as needed.

## 2013-05-25 ENCOUNTER — Ambulatory Visit (INDEPENDENT_AMBULATORY_CARE_PROVIDER_SITE_OTHER): Payer: No Typology Code available for payment source | Admitting: Family Medicine

## 2013-05-25 VITALS — BP 100/72 | HR 84 | Temp 98.1°F | Resp 18 | Ht 64.5 in | Wt 177.6 lb

## 2013-05-25 DIAGNOSIS — K602 Anal fissure, unspecified: Secondary | ICD-10-CM

## 2013-05-25 DIAGNOSIS — R197 Diarrhea, unspecified: Secondary | ICD-10-CM

## 2013-05-25 DIAGNOSIS — E860 Dehydration: Secondary | ICD-10-CM

## 2013-05-25 LAB — POCT CBC
GRANULOCYTE PERCENT: 68.4 % (ref 37–80)
HCT, POC: 47.3 % (ref 37.7–47.9)
Hemoglobin: 15.1 g/dL (ref 12.2–16.2)
LYMPH, POC: 1.2 (ref 0.6–3.4)
MCH, POC: 31.3 pg — AB (ref 27–31.2)
MCHC: 31.9 g/dL (ref 31.8–35.4)
MCV: 98.1 fL — AB (ref 80–97)
MID (CBC): 0.4 (ref 0–0.9)
MPV: 8.5 fL (ref 0–99.8)
PLATELET COUNT, POC: 297 10*3/uL (ref 142–424)
POC GRANULOCYTE: 3.4 (ref 2–6.9)
POC LYMPH %: 24.3 % (ref 10–50)
POC MID %: 7.3 % (ref 0–12)
RBC: 4.82 M/uL (ref 4.04–5.48)
RDW, POC: 14.4 %
WBC: 5 10*3/uL (ref 4.6–10.2)

## 2013-05-25 LAB — POCT URINE PREGNANCY: PREG TEST UR: NEGATIVE

## 2013-05-25 NOTE — Patient Instructions (Addendum)
Rest, drink fluids and eat a bland and easy to digest diet.  Let us know if you are not better soon!  Use a soft toilet paper brand and try a diaper rash type cream to soothe your skin.

## 2013-05-25 NOTE — Progress Notes (Addendum)
[facility] [address] [place] [address] [phone]  Date:  05/25/2013   Name:  Crystal Howell   DOB:  11-Apr-1990   MRN:  [id number]  PCP:  Johnnette Barrios, RN    Chief Complaint: Diarrhea and Nausea   History of Present Illness:  Crystal Howell is a 23 y.o. very pleasant female patient who presents with the following:  Yesterday she had diarrhea- all day. Went about 12x yesterday and 3x so far today.  She noted some bright red blood when she would wipe starting yesterday.  No blood in the toilet.   She does not have any vomiting, but has had dry heaves.   No sick contacts at home, but possibly at work. She has not noted a fever.  She does notice sharp pains in her lower abdomen.   She did eat a sandwich today but had diarrhea right away after this.   She is urinating.   No cough, ST.   LMP 12/29- she is on OCP and does not always get menses.   She did drink some ginger ale and some lemonade today.   Patient Active Problem List   Diagnosis Date Noted  . Allergic rhinitis 12/12/2011  . Overweight 10/31/2011    Past Medical History  Diagnosis Date  . Hx of menorrhagia   . Allergy   . Anemia   . Chiari malformation   . [id number](563.8)     Past Surgical History  Procedure Laterality Date  . Wisdom tooth extraction      History  Substance Use Topics  . Smoking status: Never Smoker   . Smokeless tobacco: Never Used  . Alcohol Use: No    Family History  Problem Relation Age of Onset  . Thyroid disease Mother   . Anemia Mother   . Heart disease Maternal Grandmother   . Emphysema Maternal Grandmother   . Stroke Maternal Grandmother   . Mental illness Maternal Grandmother   . Lung cancer Maternal Grandfather   . Liver disease Father     Allergies  Allergen Reactions  . Doxycycline   . Erythromycin     Medication list has been reviewed and updated.  Current Outpatient Prescriptions on File Prior to Visit   Medication Sig Dispense Refill  . albuterol (PROVENTIL HFA;VENTOLIN HFA) 108 (90 BASE) MCG/ACT inhaler Inhale 2 puffs into the lungs every 6 (six) hours as needed.      . fluticasone (FLONASE) 50 MCG/ACT nasal spray Place 2 sprays into the nose daily.      Marland Kitchen gabapentin (NEURONTIN) 300 MG capsule Take 1 capsule (300 mg total) by mouth 3 (three) times daily as needed (for headaches).  20 capsule  0  . loratadine (CLARITIN) 10 MG tablet Take 10 mg by mouth daily.      . Norethindrone-Ethinyl Estradiol-Fe Biphas (LO LOESTRIN FE) 1 MG-10 MCG / 10 MCG tablet Take 1 tablet by mouth daily.       No current facility-administered medications on file prior to visit.    Review of Systems:  As per HPI- otherwise negative.   Physical Examination: Filed Vitals:   05/25/13 1500  BP: 100/72  Pulse: 122  Temp: 98.1 F (36.7 C)  Resp: 18   Filed Vitals:   05/25/13 1500  Height: 5' 4.5" (1.638 m)  Weight: 177 lb 9.6 oz (80.559 kg)   Body mass index is 30.03 kg/(m^2). Ideal Body Weight: Weight in (lb) to have BMI = 25:  147.6  GEN: WDWN, NAD, Non-toxic, A & O x 3, appears well HEENT: Atraumatic, Normocephalic. Neck supple. No masses, No LAD. Ears and Nose: No external deformity. CV: RRR, No M/G/R. No JVD. No thrill. No extra heart sounds. PULM: CTA B, no wheezes, crackles, rhonchi. No retractions. No resp. distress. No accessory muscle use. ABD: S, NT, ND, +BS. No rebound. No HSM.  Tender over her epigastric area- mild EXTR: No c/c/e NEURO Normal gait.  PSYCH: Normally interactive. Conversant. Not depressed or anxious appearing.  Calm demeanor.  Anal area shows mild skin breakdown, likely from excessive wiping  Started IV for hydration- given 1 liter of IVF.  Pulse to 84 BPM  Results for orders placed in visit on 05/25/13  POCT CBC      Result Value Ref Range   WBC 5.0  4.6 - 10.2 K/uL   Lymph, poc 1.2  0.6 - 3.4   POC LYMPH PERCENT 24.3  10 - 50 %L   MID (cbc) 0.4  0 - 0.9   POC MID  % 7.3  0 - 12 %M   POC Granulocyte 3.4  2 - 6.9   Granulocyte percent 68.4  37 - 80 %G   RBC 4.82  4.04 - 5.48 M/uL   Hemoglobin 15.1  12.2 - 16.2 g/dL   HCT, POC 47.3  37.7 - 47.9 %   MCV 98.1 (*) 80 - 97 fL   MCH, POC 31.3 (*) 27 - 31.2 pg   MCHC 31.9  31.8 - 35.4 g/dL   RDW, POC 14.4     Platelet Count, POC 297  142 - 424 K/uL   MPV 8.5  0 - 99.8 fL  POCT URINE PREGNANCY      Result Value Ref Range   Preg Test, Ur Negative       Assessment and Plan: Diarrhea - Plan: POCT CBC, Comprehensive metabolic panel, POCT urine pregnancy  Anal fissure  Treat for dehydration with IVF.  She felt better, pulse back to normal.   See patient instructions for more details.    Signed Lamar Blinks, MD  2/17:  Called to check on her- she is doing well, diarrhea is resolved.  Went over her labs- her K is high but blood is hemolyzed.  Also, her AST is elevated.  She will come by for a lab visit only tomorrow for a repeat CMP.

## 2013-05-26 LAB — COMPREHENSIVE METABOLIC PANEL
ALBUMIN: 4.9 g/dL (ref 3.5–5.2)
ALK PHOS: 29 U/L — AB (ref 39–117)
ALT: 22 U/L (ref 0–35)
AST: 68 U/L — AB (ref 0–37)
BILIRUBIN TOTAL: 0.7 mg/dL (ref 0.2–1.2)
BUN: 9 mg/dL (ref 6–23)
CO2: 23 mEq/L (ref 19–32)
Calcium: 9.3 mg/dL (ref 8.4–10.5)
Chloride: 104 mEq/L (ref 96–112)
Creat: 0.76 mg/dL (ref 0.50–1.10)
Glucose, Bld: 82 mg/dL (ref 70–99)
POTASSIUM: 6 meq/L — AB (ref 3.5–5.3)
SODIUM: 135 meq/L (ref 135–145)
TOTAL PROTEIN: 8.4 g/dL — AB (ref 6.0–8.3)

## 2013-05-26 NOTE — Addendum Note (Signed)
Addended by: Lamar Blinks C on: 05/26/2013 04:14 PM   Modules accepted: Orders

## 2013-05-27 ENCOUNTER — Other Ambulatory Visit (INDEPENDENT_AMBULATORY_CARE_PROVIDER_SITE_OTHER): Payer: No Typology Code available for payment source

## 2013-05-27 DIAGNOSIS — R197 Diarrhea, unspecified: Secondary | ICD-10-CM

## 2013-05-27 LAB — COMPREHENSIVE METABOLIC PANEL
ALT: 14 U/L (ref 0–35)
AST: 18 U/L (ref 0–37)
Albumin: 3.9 g/dL (ref 3.5–5.2)
Alkaline Phosphatase: 28 U/L — ABNORMAL LOW (ref 39–117)
BILIRUBIN TOTAL: 0.4 mg/dL (ref 0.2–1.2)
BUN: 8 mg/dL (ref 6–23)
CHLORIDE: 105 meq/L (ref 96–112)
CO2: 27 mEq/L (ref 19–32)
Calcium: 9.3 mg/dL (ref 8.4–10.5)
Creat: 0.64 mg/dL (ref 0.50–1.10)
GLUCOSE: 88 mg/dL (ref 70–99)
POTASSIUM: 3.8 meq/L (ref 3.5–5.3)
SODIUM: 139 meq/L (ref 135–145)
TOTAL PROTEIN: 6.7 g/dL (ref 6.0–8.3)

## 2013-05-28 ENCOUNTER — Telehealth: Payer: Self-pay | Admitting: Family Medicine

## 2013-05-28 NOTE — Telephone Encounter (Signed)
Called and [facility]- repeat labs are fine.  Take care!

## 2013-09-15 ENCOUNTER — Telehealth: Payer: Self-pay | Admitting: Neurology

## 2013-09-15 NOTE — Telephone Encounter (Signed)
Patient wanting Dr. Rexene Alberts to be aware that she is requesting meclizine for her dizziness.  She uses [facility] on [address] [phone].

## 2013-09-15 NOTE — Telephone Encounter (Signed)
Pt is calling requesting an Rx for meclizine. Medication is not listed on pt's med list. Please advise

## 2013-09-15 NOTE — Telephone Encounter (Signed)
Please advise patient to get request her meclizine prescription refill from the original prescriber, I am not sure who prescribed it. Thanks

## 2013-09-16 ENCOUNTER — Other Ambulatory Visit: Payer: Self-pay | Admitting: Family Medicine

## 2013-09-17 NOTE — Telephone Encounter (Signed)
Called pt and left message informing her per Dr. Rexene Alberts that the pt needed to get her Rx refill from the original presciber that prescribed the medication. I advised the pt that if she has any other problems, questions or concerns to call the office.

## 2013-10-14 ENCOUNTER — Telehealth: Payer: Self-pay | Admitting: *Deleted

## 2013-10-14 ENCOUNTER — Ambulatory Visit (HOSPITAL_COMMUNITY)
Admission: RE | Admit: 2013-10-14 | Discharge: 2013-10-14 | Disposition: A | Discharge status: Home / Self Care | Payer: No Typology Code available for payment source | Source: Ambulatory Visit | Attending: Family Medicine | Admitting: Family Medicine

## 2013-10-14 ENCOUNTER — Other Ambulatory Visit: Payer: Self-pay | Admitting: Family Medicine

## 2013-10-14 ENCOUNTER — Ambulatory Visit (INDEPENDENT_AMBULATORY_CARE_PROVIDER_SITE_OTHER): Payer: No Typology Code available for payment source | Admitting: Family Medicine

## 2013-10-14 VITALS — BP 112/70 | HR 70 | Temp 98.7°F | Resp 16 | Ht 64.0 in | Wt 187.6 lb

## 2013-10-14 DIAGNOSIS — R42 Dizziness and giddiness: Secondary | ICD-10-CM

## 2013-10-14 DIAGNOSIS — M25561 Pain in right knee: Secondary | ICD-10-CM

## 2013-10-14 DIAGNOSIS — R5383 Other fatigue: Secondary | ICD-10-CM

## 2013-10-14 DIAGNOSIS — M79604 Pain in right leg: Secondary | ICD-10-CM

## 2013-10-14 DIAGNOSIS — R5381 Other malaise: Secondary | ICD-10-CM

## 2013-10-14 DIAGNOSIS — M25559 Pain in unspecified hip: Secondary | ICD-10-CM

## 2013-10-14 DIAGNOSIS — M79609 Pain in unspecified limb: Secondary | ICD-10-CM | POA: Insufficient documentation

## 2013-10-14 DIAGNOSIS — M25569 Pain in unspecified knee: Secondary | ICD-10-CM

## 2013-10-14 DIAGNOSIS — M25551 Pain in right hip: Secondary | ICD-10-CM

## 2013-10-14 LAB — CBC
HEMATOCRIT: 40.2 % (ref 36.0–46.0)
Hemoglobin: 14.5 g/dL (ref 12.0–15.0)
MCH: 32.1 pg (ref 26.0–34.0)
MCHC: 36.1 g/dL — AB (ref 30.0–36.0)
MCV: 88.9 fL (ref 78.0–100.0)
PLATELETS: 252 10*3/uL (ref 150–400)
RBC: 4.52 MIL/uL (ref 3.87–5.11)
RDW: 13.9 % (ref 11.5–15.5)
WBC: 6.3 10*3/uL (ref 4.0–10.5)

## 2013-10-14 LAB — POCT SEDIMENTATION RATE: POCT SED RATE: 14 mm/hr (ref 0–22)

## 2013-10-14 LAB — CK: Total CK: 76 U/L (ref 7–177)

## 2013-10-14 MED ORDER — NABUMETONE 750 MG PO TABS: 750.0000 mg | ORAL_TABLET | Freq: Two times a day (BID) | ORAL | Status: DC | PRN

## 2013-10-14 NOTE — Telephone Encounter (Signed)
[facility] called results- Negative for DVT. Pt has left for home.

## 2013-10-14 NOTE — Progress Notes (Addendum)
This chart was scribed for Crystal Cheadle, MD by Crystal Howell, ED Scribe. This patient was seen in room 2 and the patient's care was started at 6:15 PM.  Subjective:    Patient ID: Crystal Howell, female    DOB: [birth year], 23 y.o.   MRN: [id number]  Chief Complaint  Patient presents with  . Leg Pain    Started on Friday without injury  . Hip Pain    HPI HPI Comments: Crystal Howell is a 23 y.o. female who presents to the [facility] complaining of right leg pain that started approximately 5 days ago. Pt is also complaining of associated right hip pain. Pt denies any recent falls. She states that she first noticed the pain on Friday. 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 She states that pain is worsened by standing for long periods of time and while driving. Pt states that sometimes while sitting at her desk she has to take her right leg from a bent position and straighten it to ease the pain. She is unsure if the pain is worse in her hip or leg, she states that the severity of the pain fluctuates between her affected areas. Pt reports associated occasional calf pain. Crystal Howell is currently on an IUD as a birth control method. She reports a fall in 2010. Reports taking Advil and other OTC medications with minimal relief. She denies any recent long travels, cough, palpitation, SOB, chest pain, nausea, emesis, diarrhea, constipation, leg swelling, or fever.      Patient Active Problem List   Diagnosis Date Noted  . Allergic rhinitis 12/12/2011  . Overweight 10/31/2011   Past Medical History  Diagnosis Date  . Hx of menorrhagia   . Allergy   . Anemia   . Chiari malformation   . [id number](478.2)    Past Surgical History  Procedure Laterality Date  . Wisdom tooth extraction     Allergies  Allergen Reactions  . Meclizine Nausea And Vomiting  . Doxycycline   . Erythromycin    Prior to Admission medications   Medication Sig  Start Date End Date Taking? Authorizing Provider  albuterol (PROVENTIL HFA;VENTOLIN HFA) 108 (90 BASE) MCG/ACT inhaler Inhale 2 puffs into the lungs every 6 (six) hours as needed.   Yes Historical Provider, MD  gabapentin (NEURONTIN) 300 MG capsule Take 1 capsule (300 mg total) by mouth 3 (three) times daily as needed (for headaches). 01/31/13  Yes Crystal Caldron Baker, PA-C  loratadine (CLARITIN) 10 MG tablet Take 10 mg by mouth daily.   Yes Historical Provider, MD  fluticasone (FLONASE) 50 MCG/ACT nasal spray Place 2 sprays into the nose daily.    Historical Provider, MD  Norethindrone-Ethinyl Estradiol-Fe Biphas (LO LOESTRIN FE) 1 MG-10 MCG / 10 MCG tablet Take 1 tablet by mouth daily. 10/31/11 05/02/13  Crystal Dawley, MD   History   Social History  . Marital Status: Single    Spouse Name: N/A    Number of Children: N/A  . Years of Education: N/A   Occupational History  . Not on file.   Social History Main Topics  . Smoking status: Never Smoker   . Smokeless tobacco: Never Used  . Alcohol Use: No  . Drug Use: No  . Sexual Activity: Yes    Birth Control/ Protection: Pill   Other Topics Concern  . Not on file   Social History Narrative  . No narrative on file  Review of Systems  Constitutional: Positive for fatigue. Negative for fever, chills, diaphoresis and unexpected weight change.  Respiratory: Negative for chest tightness and shortness of breath.   Cardiovascular: Negative for chest pain, palpitations and leg swelling.  Gastrointestinal: Negative for abdominal pain and blood in stool.  Musculoskeletal: Positive for arthralgias and myalgias. Negative for gait problem and joint swelling.  Skin: Negative for rash.  Neurological: Positive for dizziness, weakness and light-headedness. Negative for syncope and headaches.  Hematological: Negative for adenopathy. Does not bruise/bleed easily.  All other systems reviewed and are negative.      Objective:   Physical Exam    Nursing note and vitals reviewed. Constitutional: She appears well-developed and well-nourished. No distress.  HENT:  Head: Normocephalic and atraumatic.  Eyes: Conjunctivae are normal. Right eye exhibits no discharge. Left eye exhibits no discharge.  Neck: Neck supple.  Cardiovascular: Normal rate, regular rhythm and normal heart sounds.  Exam reveals no gallop and no friction rub.   No murmur heard. Pulmonary/Chest: Effort normal and breath sounds normal. No respiratory distress.  Abdominal: Soft. She exhibits no distension.  Musculoskeletal: She exhibits no edema and no tenderness.  Right calf is 43 cm from right tibial tuberosity. Left calf is 43 cm from left tibial tuberosity. Positive straight leg raise on right. Tenderness to palpation over greater trochanter on right. No tenderness noted to SI join, paraspinal muscles, or cervical spine. Normal hip ROM on right and left. Pain with varus valgus stress test but no laxity. Positive Homan's sign. Negative McMurray's.   Neurological: She is alert.  Reflex Scores:      Patellar reflexes are 2+ on the right side and 2+ on the left side.      Achilles reflexes are 2+ on the right side and 2+ on the left side. Skin: Skin is warm and dry.  Psychiatric: She has a normal mood and affect. Her behavior is normal. Thought content normal.    Filed Vitals:   10/14/13 1743  BP: 112/70  Pulse: 70  Temp: 98.7 F (37.1 C)  TempSrc: Oral  Resp: 16  Height: 5\' 4"  (1.626 m)  Weight: 187 lb 9.6 oz (85.095 kg)  SpO2: 99%    Assessment & Plan:   Right leg pain - Plan: CK, CBC, Lower Extremity Venous Duplex Right, POCT SEDIMENTATION RATE, CANCELED: Sedimentation rate, CANCELED: US Venous Img Lower Unilateral Right - need to sent pt for stat venous doppler to r/o DVT though is lower on differential. Suspect piriformis syndrome with sciatica so try relafen if doppler is negative. Rec f/u w/ me immed after doppler to review labs and see if we need hip  and/or knee xray.  Hip pain, right - Plan: POCT SEDIMENTATION RATE  Knee pain, acute, right  Other malaise and fatigue  Lightheadedness  Meds ordered this encounter  Medications  . nabumetone (RELAFEN) 750 MG tablet    Sig: Take 1 tablet (750 mg total) by mouth 2 (two) times daily as needed for moderate pain.    Dispense:  60 tablet    Refill:  0    I personally performed the services described in this documentation, which was scribed in my presence. The recorded information has been reviewed and considered, and addended by me as needed.  Crystal Cheadle, MD MPH  Results for orders placed in visit on 10/14/13  CK      Result Value Ref Range   Total CK    7 - 232 U/L  CBC  Result Value Ref Range   WBC 6.3  4.0 - 10.5 K/uL   RBC 4.52  3.87 - 5.11 MIL/uL   Hemoglobin 14.5  12.0 - 15.0 g/dL   HCT 40.2  36.0 - 46.0 %   MCV 88.9  78.0 - 100.0 fL   MCH 32.1  26.0 - 34.0 pg   MCHC 36.1 (*) 30.0 - 36.0 g/dL   RDW 13.9  11.5 - 15.5 %   Platelets 252  150 - 400 K/uL  POCT SEDIMENTATION RATE      Result Value Ref Range   POCT SED RATE 14  0 - 22 mm/hr     7:09 PM--Over 40 minutes spent in evaluation of pt

## 2013-10-14 NOTE — Progress Notes (Signed)
VASCULAR LAB PRELIMINARY  PRELIMINARY  PRELIMINARY  PRELIMINARY  Right lower extremity venous duplex completed.    Preliminary report:  Right:  No evidence of DVT, superficial thrombosis, or Baker's cyst.  Destan Franchini, RVT 10/14/2013, 7:58 PM

## 2013-10-14 NOTE — Patient Instructions (Addendum)
Go to the [facility] ER and check in as an "outpatient" to see Juliann Pulse - the vascular technician who is on-call tonight and will do your ultrasound.  Do not check in as ER patient to see the ER doctor. If your ultrasound is positive for a blood clot, I will talk to you tonight and call in a blood thinner to your pharmacy. Please come back at 9 a.m. Tomorrow morning to see me so you can get your labs back and we can decide if you need hip and/or knee x-rays.  Piriformis Syndrome with Rehab Piriformis syndrome is a condition the affects the nervous system in the area of the hip, and is characterized by pain and possibly a loss of feeling in the backside (posterior) thigh that may extend down the entire length of the leg. The symptoms are caused by an increase in pressure on the sciatic nerve by the piriformis muscle, which is on the back of the hip and is responsible for externally rotating the hip. The sciatic nerve and its branches connect to much of the leg. Normally the sciatic nerve runs between the piriformis muscle and other muscles. However, in certain individuals the nerve runs through the muscle, which causes an increase in pressure on the nerve and results in the symptoms of piriformis syndrome. SYMPTOMS   Pain, tingling, numbness, or burning in the back of the thigh that may also extend down the entire leg.  Occasionally, tenderness in the buttock.  Loss of function of the leg.  Pain that worsens when using the piriformis muscle (running, jumping, or stairs).  Pain that increases with prolonged sitting.  Pain that is lessened by laying flat on the back. CAUSES   Piriformis syndrome is the result of an increase in pressure placed on the sciatic nerve. Often times piriformis syndrome is an overuse injury.  Stress placed on the nerve from a sudden increase in the intensity, frequency, or duration of training.  Compensation of other extremity injuries. RISK INCREASES WITH:  Sports  that involve the piriformis muscle (running, walking or jumping).  You are born with (congenital) a defect in which the sciatic nerve passes through the muscle. PREVENTION  Warm up and stretch properly before activity.  Allow for adequate recovery between workouts.  Maintain physical fitness:  Strength, flexibility, and endurance.  Cardiovascular fitness. PROGNOSIS  If treated properly, then the symptoms of piriformis syndrome usually resolve in 2 to 6 weeks. RELATED COMPLICATIONS   Persistent and possibly permanent pain and numbness in the lower extremity.  Weakness of the extremity that may progress to disability and inability to compete. TREATMENT  The most effective treatment for piriformis syndrome is rest from any activities that aggravate the symptoms. Ice and pain medication may help reduce pain and inflammation. The use of strengthening and stretching exercises may help reduce pain with activity. These exercises may be performed at home or with a therapist. A referral to a therapist may be given for further evaluation and treatment, such as ultrasound. Corticosteroid injections may be given to reduce inflammation that is causing pressure to be placed on the sciatic nerve. If non-surgical (conservative) treatment is unsuccessful, then surgery may be recommended.  MEDICATION   If pain medication is necessary, then nonsteroidal anti-inflammatory medications, such as aspirin and ibuprofen, or other minor pain relievers, such as acetaminophen, are often recommended.  Do not take pain medication for 7 days before surgery.  Prescription pain relievers may be given if deemed necessary by your caregiver. Use  only as directed and only as much as you need.  Corticosteroid injections may be given by your caregiver. These injections should be reserved for the most serious cases, because they may only be given a certain number of times. HEAT AND COLD:   Cold treatment (icing) relieves  pain and reduces inflammation. Cold treatment should be applied for 10 to 15 minutes every 2 to 3 hours for inflammation and pain and immediately after any activity that aggravates your symptoms. Use ice packs or massage the area with a piece of ice (ice massage).  Heat treatment may be used prior to performing the stretching and strengthening activities prescribed by your caregiver, physical therapist, or athletic trainer. Use a heat pack or soak the injury in warm water. SEEK IMMEDIATE MEDICAL CARE IF:  Treatment seems to offer no benefit, or the condition worsens.  Any medications produce adverse side effects. EXERCISES RANGE OF MOTION (ROM) AND STRETCHING EXERCISES - Piriformis Syndrome These exercises may help you when beginning to rehabilitate your injury. Your symptoms may resolve with or without further involvement from your physician, physical therapist or athletic trainer. While completing these exercises, remember:   Restoring tissue flexibility helps normal motion to return to the joints. This allows healthier, less painful movement and activity.  An effective stretch should be held for at least 30 seconds.  A stretch should never be painful. You should only feel a gentle lengthening or release in the stretched tissue. STRETCH - Hip Rotators  Lie on your back on a firm surface. Grasp your right / left knee with your right / left hand and your ankle with your opposite hand.  Keeping your hips and shoulders firmly planted, gently pull your right / left knee and rotate your lower leg toward your opposite shoulder until you feel a stretch in your buttocks.  Hold this stretch for __________ seconds. Repeat this stretch __________ times. Complete this stretch __________ times per day. STRETCH - Iliotibial Band  On the floor or bed, lie on your side so your right / left leg is on top. Bend your knee and grab your ankle.  Slowly bring your knee back so that your thigh is in line with  your trunk. Keep your heel at your buttocks and gently arch your back so your head, shoulders and hips line up.  Slowly lower your leg so that your knee approaches the floor/bed until you feel a gentle stretch on the outside of your right / left thigh. If you do not feel a stretch and your knee will not fall farther, place the heel of your opposite foot on top of your knee and pull your thigh down farther.  Hold this stretch for __________ seconds. Repeat __________ times. Complete __________ times per day. STRENGTHENING EXERCISES - Piriformis Syndrome  These are some of the caregiver again or until your symptoms are resolved. Remember:   Strong muscles with good endurance tolerate stress better.  Do the exercises as initially prescribed by your caregiver. Progress slowly with each exercise, gradually increasing the number of repetitions and weight used under their guidance. STRENGTH - Hip Abductors, Straight Leg Raises Be aware of your form throughout the entire exercise so that you exercise the correct muscles. Sloppy form means that you are not strengthening the correct muscles.  Lie on your side so that your head, shoulders, knee and hip line up. You may bend your lower knee to help maintain your balance. Your right / left leg should be on top.  Roll your hips slightly forward, so that your hips are stacked directly over each other and your right / left knee is facing forward.  Lift your top leg up 4-6 inches, leading with your heel. Be sure that your foot does not drift forward or that your knee does not roll toward the ceiling.  Hold this position for __________ seconds. You should feel the muscles in your outer hip lifting (you may not notice this until your leg begins to tire).  Slowly lower your leg to the starting position. Allow the muscles to fully relax before beginning the next repetition. Repeat __________ times. Complete this exercise __________ times per day.  STRENGTH - Hip  Abductors, Quadriped  On a firm, lightly padded surface, position yourself on your hands and knees. Your hands should be directly below your shoulders and your knees should be directly below your hips.  Keeping your right / left knee bent, lift your leg out to the side. Keep your legs level and in line with your shoulders.  Position yourself on your hands and knees.  Hold for __________ seconds.  Keeping your trunk steady and your hips level, slowly lower your leg to the starting position. Repeat __________ times. Complete this exercise __________ times per day.  STRENGTH - Hip Abductors, Standing  Tie one end of a rubber exercise band/tubing to a secure surface (table, pole) and tie a loop at the other end.  Place the loop around your right / left ankle. Keeping your ankle with the band directly opposite of the secured end, step away until there is tension in the tube/band.  Hold onto a chair as needed for balance.  Keeping your back upright, your shoulders over your hips, and your toes pointing forward, lift your right / left leg out to your side. Be sure to lift your leg with your hip muscles. Do not "throw" your leg or tip your body to lift your leg.  Slowly and with control, return to the starting position. Repeat exercise __________ times. Complete this exercise __________ times per day.  Document Released: 03/26/2005 Document Revised: 09/25/2011 Document Reviewed: 07/08/2008 [facility] Patient Information 2015 [place], [place]. This information is not intended to replace advice given to you by your health care provider. Make sure you discuss any questions you have with your health care provider.  Patellofemoral Syndrome If you have had pain in the front of your knee for a long time, chances are good that you have patellofemoral syndrome. The word patella refers to the kneecap. Femoral (or femur) refers to the thigh bone. That is the bone the kneecap sits on. The kneecap is shaped like  a triangle. Its job is to protect the knee and to improve the efficiency of your thigh muscles (quadriceps). The underside of the kneecap is made of smooth tissue (cartilage). This lets the kneecap slide up and down as the knee moves. Sometimes this cartilage becomes soft. Your healthcare provider may say the cartilage breaks down. That is patellofemoral syndrome. It can affect one knee, or both. The condition is sometimes called patellofemoral pain syndrome. That is because the condition is painful. The pain usually gets worse with activity. Sitting for a long time with the knee bent also makes the pain worse. It usually gets better with rest and proper treatment. CAUSES  No one is sure why some people develop this problem and others do not. Runners often get it. One name for the condition is "runner's knee." However, some people run for years  and never have knee pain. Certain things seem to make patellofemoral syndrome more likely. They include:  Moving out of alignment. The kneecap is supposed to move in a straight line when the thigh muscle pulls on it. Sometimes the kneecap moves in poor alignment. That can make the knee swell and hurt. Some experts believe it also wears down the cartilage.  Injury to the kneecap.  Strain on the knee. This may occur during sports activity. Soccer, running, skiing and cycling can put excess stress on the knee.  Being flat-footed or knock-kneed. SYMPTOMS   Knee pain.  Pain under the kneecap. This is usually a dull, aching pain.  Pain in the knee when doing certain things: squatting, kneeling, going up or down stairs.  Pain in the knee when you stand up after sitting down for awhile.  Tightness in the knee.  Loss of muscle strength in the thigh.  Swelling of the knee. DIAGNOSIS  Healthcare providers often send people with knee pain to an orthopedic caregiver. This person has special training to treat problems with bones and joints. To decide what is  causing your knee pain, your caregiver will probably:  Do a physical exam. This will probably include:  Asking about symptoms you have noticed.  Asking about your activities and any injuries.  Feeling your knee. Moving it. This will help test the knee's strength. It will also check alignment (whether the knee and leg are aligned normally).  Order some tests, such as:  Imaging tests. They create pictures of the inside of the knee. Tests may include:  X-rays.  Computed tomography (CT) scan. This uses X-rays and a computer to show more detail.  Magnetic resonance imaging (MRI). This test uses magnets, radio waves and a computer to make pictures. TREATMENT   Medication is almost always used first. It can relieve pain. It also can reduce swelling. Non-steroidal anti-inflammatory medicines (called NSAIDs) are usually suggested. Sometimes a stronger form is needed. A stronger form would require a prescription.  Other treatment may be needed after the swelling goes down. Possibilities include:  Exercise. Certain exercises can make the muscles around the knee stronger which decreases the pressure on the knee cap. This includes the thigh muscle. Certain exercises also may be suggested to increase your flexibility.  A knee brace. This gives the knee extra support and helps align the movement of the knee cap.  Orthotics. These are special shoe inserts. They can help keep your leg and knee aligned.  Surgery is sometimes needed. This is rare. Options include:  Arthroscopy. The surgeon uses a special tool to remove any damaged pieces of the kneecap. Only a few small incisions (cuts) are needed.  Realignment. This is open surgery. The goals are to reduce pressure and fix the way the kneecap moves. HOME CARE INSTRUCTIONS   Take any medication prescribed by your healthcare provider. Follow the directions carefully.  If your knee is swollen:  Put ice or cold packs on it. Do this for 20 to 30  minutes, 3 to 4 times a day.  Keep the knee raised. Make sure it is supported. Put a pillow under it.  Rest your knee. For example, take the elevator instead of the stairs for awhile. Or, take a break from sports activity that strain your knee. Try walking or swimming instead.  Whenever you are active:  Use an elastic bandage on your knee. This gives it support.  After any activity, put ice or cold packs on your knees. Do  this for about 10 to 20 minutes.  Make sure you wear shoes that give good support. Make sure they are not worn down. The heels should not slant in or out. SEEK MEDICAL CARE IF:   Knee pain gets worse. Or it does not go away, even after taking pain medicine.  Swelling does not go down.  Your thigh muscle becomes weak.  You have an oral temperature above 102 F (38.9 C). SEEK IMMEDIATE MEDICAL CARE IF:  You have an oral temperature above 102 F (38.9 C), not controlled by medicine. Document Released: 03/14/2009 Document Revised: 06/18/2011 Document Reviewed: 03/14/2009 [facility] Patient Information 2015 [place], [place]. This information is not intended to replace advice given to you by your health care provider. Make sure you discuss any questions you have with your health care provider.

## 2013-10-15 ENCOUNTER — Telehealth: Payer: Self-pay | Admitting: *Deleted

## 2013-10-15 ENCOUNTER — Telehealth: Payer: Self-pay | Admitting: Family Medicine

## 2013-10-15 ENCOUNTER — Encounter: Payer: Self-pay | Admitting: Family Medicine

## 2013-10-15 ENCOUNTER — Ambulatory Visit (INDEPENDENT_AMBULATORY_CARE_PROVIDER_SITE_OTHER): Payer: No Typology Code available for payment source | Admitting: Family Medicine

## 2013-10-15 VITALS — BP 106/74 | HR 74 | Temp 98.2°F | Resp 18 | Ht 64.5 in | Wt 184.4 lb

## 2013-10-15 DIAGNOSIS — G5701 Lesion of sciatic nerve, right lower limb: Secondary | ICD-10-CM

## 2013-10-15 DIAGNOSIS — K5900 Constipation, unspecified: Secondary | ICD-10-CM

## 2013-10-15 DIAGNOSIS — L748 Other eccrine sweat disorders: Secondary | ICD-10-CM

## 2013-10-15 DIAGNOSIS — R42 Dizziness and giddiness: Secondary | ICD-10-CM

## 2013-10-15 DIAGNOSIS — R5381 Other malaise: Secondary | ICD-10-CM

## 2013-10-15 DIAGNOSIS — R5383 Other fatigue: Secondary | ICD-10-CM

## 2013-10-15 DIAGNOSIS — L750 Bromhidrosis: Secondary | ICD-10-CM

## 2013-10-15 DIAGNOSIS — G5700 Lesion of sciatic nerve, unspecified lower limb: Secondary | ICD-10-CM

## 2013-10-15 DIAGNOSIS — E860 Dehydration: Secondary | ICD-10-CM

## 2013-10-15 LAB — COMPREHENSIVE METABOLIC PANEL
ALT: 11 U/L (ref 0–35)
AST: 17 U/L (ref 0–37)
Albumin: 4.8 g/dL (ref 3.5–5.2)
Alkaline Phosphatase: 37 U/L — ABNORMAL LOW (ref 39–117)
BILIRUBIN TOTAL: 0.4 mg/dL (ref 0.2–1.2)
BUN: 11 mg/dL (ref 6–23)
CO2: 19 mEq/L (ref 19–32)
Calcium: 9.8 mg/dL (ref 8.4–10.5)
Chloride: 103 mEq/L (ref 96–112)
Creat: 0.73 mg/dL (ref 0.50–1.10)
Glucose, Bld: 89 mg/dL (ref 70–99)
POTASSIUM: 4.2 meq/L (ref 3.5–5.3)
Sodium: 136 mEq/L (ref 135–145)
Total Protein: 7.2 g/dL (ref 6.0–8.3)

## 2013-10-15 LAB — POCT UA - MICROSCOPIC ONLY
CASTS, UR, LPF, POC: NEGATIVE
Crystals, Ur, HPF, POC: NEGATIVE
MUCUS UA: POSITIVE
Yeast, UA: NEGATIVE

## 2013-10-15 LAB — POCT URINALYSIS DIPSTICK
Bilirubin, UA: NEGATIVE
Glucose, UA: NEGATIVE
Ketones, UA: NEGATIVE
Leukocytes, UA: NEGATIVE
Nitrite, UA: NEGATIVE
PROTEIN UA: NEGATIVE
SPEC GRAV UA: 1.02
UROBILINOGEN UA: 0.2
pH, UA: 5.5

## 2013-10-15 LAB — POCT WET PREP WITH KOH
CLUE CELLS WET PREP PER HPF POC: NEGATIVE
KOH PREP POC: NEGATIVE
Trichomonas, UA: NEGATIVE
Yeast Wet Prep HPF POC: NEGATIVE

## 2013-10-15 LAB — TSH: TSH: 1.323 u[IU]/mL (ref 0.350–4.500)

## 2013-10-15 NOTE — Telephone Encounter (Signed)
LMOM to CB. 

## 2013-10-15 NOTE — Telephone Encounter (Signed)
Pt had a sed rate done here which was normal so did not want them to do one. We did add on a cmp and a tsh this morning - if they only have enough for 1 test, prefer the tsh secondary to fatigue.

## 2013-10-15 NOTE — Telephone Encounter (Signed)
[facility] called and stated that they did not have enough blood to do sed rate.  Would like for [place] call pt back for a redraw?

## 2013-10-15 NOTE — Patient Instructions (Signed)
Piriformis Syndrome with Rehab Piriformis syndrome is a condition the affects the nervous system in the area of the hip, and is characterized by pain and possibly a loss of feeling in the backside (posterior) thigh that may extend down the entire length of the leg. The symptoms are caused by an increase in pressure on the sciatic nerve by the piriformis muscle, which is on the back of the hip and is responsible for externally rotating the hip. The sciatic nerve and its branches connect to much of the leg. Normally the sciatic nerve runs between the piriformis muscle and other muscles. However, in certain individuals the nerve runs through the muscle, which causes an increase in pressure on the nerve and results in the symptoms of piriformis syndrome. SYMPTOMS   Pain, tingling, numbness, or burning in the back of the thigh that may also extend down the entire leg.  Occasionally, tenderness in the buttock.  Loss of function of the leg.  Pain that worsens when using the piriformis muscle (running, jumping, or stairs).  Pain that increases with prolonged sitting.  Pain that is lessened by laying flat on the back. CAUSES   Piriformis syndrome is the result of an increase in pressure placed on the sciatic nerve. Often times piriformis syndrome is an overuse injury.  Stress placed on the nerve from a sudden increase in the intensity, frequency, or duration of training.  Compensation of other extremity injuries. RISK INCREASES WITH:  Sports that involve the piriformis muscle (running, walking or jumping).  You are born with (congenital) a defect in which the sciatic nerve passes through the muscle. PREVENTION  Warm up and stretch properly before activity.  Allow for adequate recovery between workouts.  Maintain physical fitness:  Strength, flexibility, and endurance.  Cardiovascular fitness. PROGNOSIS  If treated properly, then the symptoms of piriformis syndrome usually resolve in 2  to 6 weeks. RELATED COMPLICATIONS   Persistent and possibly permanent pain and numbness in the lower extremity.  Weakness of the extremity that may progress to disability and inability to compete. TREATMENT  The most effective treatment for piriformis syndrome is rest from any activities that aggravate the symptoms. Ice and pain medication may help reduce pain and inflammation. The use of strengthening and stretching exercises may help reduce pain with activity. These exercises may be performed at home or with a therapist. A referral to a therapist may be given for further evaluation and treatment, such as ultrasound. Corticosteroid injections may be given to reduce inflammation that is causing pressure to be placed on the sciatic nerve. If non-surgical (conservative) treatment is unsuccessful, then surgery may be recommended.  MEDICATION   If pain medication is necessary, then nonsteroidal anti-inflammatory medications, such as aspirin and ibuprofen, or other minor pain relievers, such as acetaminophen, are often recommended.  Do not take pain medication for 7 days before surgery.  Prescription pain relievers may be given if deemed necessary by your caregiver. Use only as directed and only as much as you need.  Corticosteroid injections may be given by your caregiver. These injections should be reserved for the most serious cases, because they may only be given a certain number of times. HEAT AND COLD:   Cold treatment (icing) relieves pain and reduces inflammation. Cold treatment should be applied for 10 to 15 minutes every 2 to 3 hours for inflammation and pain and immediately after any activity that aggravates your symptoms. Use ice packs or massage the area with a piece of ice (ice massage).    Heat treatment may be used prior to performing the stretching and strengthening activities prescribed by your caregiver, physical therapist, or athletic trainer. Use a heat pack or soak the injury in  warm water. SEEK IMMEDIATE MEDICAL CARE IF:  Treatment seems to offer no benefit, or the condition worsens.  Any medications produce adverse side effects. EXERCISES RANGE OF MOTION (ROM) AND STRETCHING EXERCISES - Piriformis Syndrome These exercises may help you when beginning to rehabilitate your injury. Your symptoms may resolve with or without further involvement from your physician, physical therapist or athletic trainer. While completing these exercises, remember:   Restoring tissue flexibility helps normal motion to return to the joints. This allows healthier, less painful movement and activity.  An effective stretch should be held for at least 30 seconds.  A stretch should never be painful. You should only feel a gentle lengthening or release in the stretched tissue. STRETCH - Hip Rotators  Lie on your back on a firm surface. Grasp your right / left knee with your right / left hand and your ankle with your opposite hand.  Keeping your hips and shoulders firmly planted, gently pull your right / left knee and rotate your lower leg toward your opposite shoulder until you feel a stretch in your buttocks.  Hold this stretch for __________ seconds. Repeat this stretch __________ times. Complete this stretch __________ times per day. STRETCH - Iliotibial Band  On the floor or bed, lie on your side so your right / left leg is on top. Bend your knee and grab your ankle.  Slowly bring your knee back so that your thigh is in line with your trunk. Keep your heel at your buttocks and gently arch your back so your head, shoulders and hips line up.  Slowly lower your leg so that your knee approaches the floor/bed until you feel a gentle stretch on the outside of your right / left thigh. If you do not feel a stretch and your knee will not fall farther, place the heel of your opposite foot on top of your knee and pull your thigh down farther.  Hold this stretch for __________ seconds. Repeat  __________ times. Complete __________ times per day. STRENGTHENING EXERCISES - Piriformis Syndrome  These are some of the caregiver again or until your symptoms are resolved. Remember:   Strong muscles with good endurance tolerate stress better.  Do the exercises as initially prescribed by your caregiver. Progress slowly with each exercise, gradually increasing the number of repetitions and weight used under their guidance. STRENGTH - Hip Abductors, Straight Leg Raises Be aware of your form throughout the entire exercise so that you exercise the correct muscles. Sloppy form means that you are not strengthening the correct muscles.  Lie on your side so that your head, shoulders, knee and hip line up. You may bend your lower knee to help maintain your balance. Your right / left leg should be on top.  Roll your hips slightly forward, so that your hips are stacked directly over each other and your right / left knee is facing forward.  Lift your top leg up 4-6 inches, leading with your heel. Be sure that your foot does not drift forward or that your knee does not roll toward the ceiling.  Hold this position for __________ seconds. You should feel the muscles in your outer hip lifting (you may not notice this until your leg begins to tire).  Slowly lower your leg to the starting position. Allow the muscles to  fully relax before beginning the next repetition. Repeat __________ times. Complete this exercise __________ times per day.  STRENGTH - Hip Abductors, Quadriped  On a firm, lightly padded surface, position yourself on your hands and knees. Your hands should be directly below your shoulders and your knees should be directly below your hips.  Keeping your right / left knee bent, lift your leg out to the side. Keep your legs level and in line with your shoulders.  Position yourself on your hands and knees.  Hold for __________ seconds.  Keeping your trunk steady and your hips level, slowly  lower your leg to the starting position. Repeat __________ times. Complete this exercise __________ times per day.  STRENGTH - Hip Abductors, Standing  Tie one end of a rubber exercise band/tubing to a secure surface (table, pole) and tie a loop at the other end.  Place the loop around your right / left ankle. Keeping your ankle with the band directly opposite of the secured end, step away until there is tension in the tube/band.  Hold onto a chair as needed for balance.  Keeping your back upright, your shoulders over your hips, and your toes pointing forward, lift your right / left leg out to your side. Be sure to lift your leg with your hip muscles. Do not "throw" your leg or tip your body to lift your leg.  Slowly and with control, return to the starting position. Repeat exercise __________ times. Complete this exercise __________ times per day.  Document Released: 03/26/2005 Document Revised: 09/25/2011 Document Reviewed: 07/08/2008 [facility] Patient Information 2015 [place], [place]. This information is not intended to replace advice given to you by your health care provider. Make sure you discuss any questions you have with your health care provider. Trochanteric Bursitis You have hip pain due to trochanteric bursitis. Bursitis means that the sack near the outside of the hip is filled with fluid and inflamed. This sack is made up of protective soft tissue. The pain from trochanteric bursitis can be severe and keep you from sleep. It can radiate to the buttocks or down the outside of the thigh to the knee. The pain is almost always worse when rising from the seated or lying position and with walking. Pain can improve after you take a few steps. It happens more often in people with hip joint and lumbar spine problems, such as arthritis or previous surgery. Very rarely the trochanteric bursa can become infected, and antibiotics and/or surgery may be needed. Treatment often includes an injection of  local anesthetic mixed with cortisone medicine. This medicine is injected into the area where it is most tender over the hip. Repeat injections may be necessary if the response to treatment is slow. You can apply ice packs over the tender area for 30 minutes every 2 hours for the next few days. Anti-inflammatory and/or narcotic pain medicine may also be helpful. Limit your activity for the next few days if the pain continues. See your caregiver in 5-10 days if you are not greatly improved.  SEEK IMMEDIATE MEDICAL CARE IF:  You develop severe pain, fever, or increased redness.  You have pain that radiates below the knee. EXERCISES STRETCHING EXERCISES - Trochantic Bursitis  These exercises may help you when beginning to rehabilitate your injury. Your symptoms may resolve with or without further involvement from your physician, physical therapist or athletic trainer. While completing these exercises, remember:   Restoring tissue flexibility helps normal motion to return to the joints. This allows  healthier, less painful movement and activity.  An effective stretch should be held for at least 30 seconds.  A stretch should never be painful. You should only feel a gentle lengthening or release in the stretched tissue. STRETCH - Iliotibial Band  On the floor or bed, lie on your side so your injured leg is on top. Bend your knee and grab your ankle.  Slowly bring your knee back so that your thigh is in line with your trunk. Keep your heel at your buttocks and gently arch your back so your head, shoulders and hips line up.  Slowly lower your leg so that your knee approaches the floor/bed until you feel a gentle stretch on the outside of your thigh. If you do not feel a stretch and your knee will not fall farther, place the heel of your opposite foot on top of your knee and pull your thigh down farther.  Hold this stretch for __________ seconds.  Repeat __________ times. Complete this exercise  __________ times per day. STRETCH - Hamstrings, Supine   Lie on your back. Loop a belt or towel over the ball of your foot as shown.  Straighten your knee and slowly pull on the belt to raise your injured leg. Do not allow the knee to bend. Keep your opposite leg flat on the floor.  Raise the leg until you feel a gentle stretch behind your knee or thigh. Hold this position for __________ seconds.  Repeat __________ times. Complete this stretch __________ times per day. STRETCH - Quadriceps, Prone   Lie on your stomach on a firm surface, such as a bed or padded floor.  Bend your knee and grasp your ankle. If you are unable to reach, your ankle or pant leg, use a belt around your foot to lengthen your reach.  Gently pull your heel toward your buttocks. Your knee should not slide out to the side. You should feel a stretch in the front of your thigh and/or knee.  Hold this position for __________ seconds.  Repeat __________ times. Complete this stretch __________ times per day. STRETCHING - Hip Flexors, Lunge Half kneel with your knee on the floor and your opposite knee bent and directly over your ankle.  Keep good posture with your head over your shoulders. Tighten your buttocks to point your tailbone downward; this will prevent your back from arching too much.  You should feel a gentle stretch in the front of your thigh and/or hip. If you do not feel any resistance, slightly slide your opposite foot forward and then slowly lunge forward so your knee once again lines up over your ankle. Be sure your tailbone remains pointed downward.  Hold this stretch for __________ seconds.  Repeat __________ times. Complete this stretch __________ times per day. STRETCH - Adductors, Lunge  While standing, spread your legs  Lean away from your injured leg by bending your opposite knee. You may rest your hands on your thigh for balance.  You should feel a stretch in your inner thigh. Hold for  __________ seconds.  Repeat __________ times. Complete this exercise __________ times per day. Document Released: 05/03/2004 Document Revised: 06/18/2011 Document Reviewed: 07/08/2008 [facility] Patient Information 2015 [place], [place]. This information is not intended to replace advice given to you by your health care provider. Make sure you discuss any questions you have with your health care provider.

## 2013-10-15 NOTE — Telephone Encounter (Signed)
Patient called back. Please return call and advise. CB # [phone]

## 2013-10-15 NOTE — Progress Notes (Signed)
Subjective:    Patient ID: Crystal Howell, female    DOB: 1990-10-01, 23 y.o.   MRN: [id number]  HPI Chief Complaint  Patient presents with  . Follow-up    right leg pain   This chart was scribed for Delman Cheadle, MD by Thea Alken, ED Scribe. This patient was seen in room 10 and the patient's care was started at 9:30 AM.  HPI Comments: Crystal Howell is a 23 y.o. female who presents to the [facility] here for a f/u regarding right leg pain and knee pain. Pt was seen last night c/o new onset right hip and knee pain. The hip pain was lower posterior gluteus as well as lateral. Pt denied injury or over use component. 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 She reported  radiating right thigh pain radiating to right foot. Last night she was sent for stat venous doppler which ruled out DVT she also had stat labs done showing normal CBC, ESR, and SPK. I suspected she has piriformis syndrome with sciatica. I recommended pt Relafen but requested she come for f/u for XRay imaging of joints or further lab work up as at the end of visit mother revealed pt has had fatigue and light headedness as well. It was very difficult to obtain blood for lab from pt last night having 4 provider to attempt. Pt was advised to increase fluid intake.   Today, pt states she took medication last night with relief to pain. She reports pain this morning but denies taking medication this morning. Pt reports fatigue and light headedness for about 10 days. She states she has swaying episode while standing. She reports h/o migraines and last had a migraine 1 week ago. Pt drink about 2 1L waters a day. She also report constipation, urine odor, abdominal distention and an increase in weight. She denies vaginal discharge, dysuria, difficulty urinating. She denies palpitation.    Past Medical History  Diagnosis Date  . Hx of menorrhagia   . Allergy   . Anemia   .  Chiari malformation   . 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 Wisdom tooth extraction     Allergies  Allergen Reactions  . Meclizine Nausea And Vomiting  . Doxycycline   . Erythromycin    Prior to Admission medications   Medication Sig Start Date End Date Taking? Authorizing Provider  albuterol (PROVENTIL HFA;VENTOLIN HFA) 108 (90 BASE) MCG/ACT inhaler Inhale 2 puffs into the lungs every 6 (six) hours as needed.   Yes Historical Provider, MD  fluticasone (FLONASE) 50 MCG/ACT nasal spray Place 2 sprays into the nose daily.   Yes Historical Provider, MD  gabapentin (NEURONTIN) 300 MG capsule Take 1 capsule (300 mg total) by mouth 3 (three) times daily as needed (for headaches). 01/31/13  Yes Liam Graham, PA-C  levonorgestrel (MIRENA) 20 MCG/24HR IUD 1 each by Intrauterine route once.   Yes Historical Provider, MD  loratadine (CLARITIN) 10 MG tablet Take 10 mg by mouth daily.   Yes Historical Provider, MD  nabumetone (RELAFEN) 750 MG tablet Take 1 tablet (750 mg total) by mouth 2 (two) times daily as needed for moderate pain. 10/14/13   Shawnee Knapp, MD  Norethindrone-Ethinyl Estradiol-Fe Biphas (LO LOESTRIN FE) 1 MG-10 MCG / 10 MCG tablet Take 1 tablet by mouth daily. 10/31/11 05/02/13  Ena Dawley, MD   History   Social  History  . Marital Status: Single    Spouse Name: N/A    Number of Children: N/A  . Years of Education: N/A   Occupational History  . Not on file.   Social History Main Topics  . Smoking status: Never Smoker   . Smokeless tobacco: Never Used  . Alcohol Use: No  . Drug Use: No  . Sexual Activity: Yes    Birth Control/ Protection: Pill   Other Topics Concern  . Not on file   Social History Narrative  . No narrative on file   Review of Systems  Constitutional: Positive for fatigue and unexpected weight change.  Cardiovascular: Positive for palpitations.  Gastrointestinal: Positive for constipation and abdominal  distention.  Genitourinary: Negative for dysuria, urgency, frequency, decreased urine volume, vaginal discharge and difficulty urinating.  Musculoskeletal: Positive for myalgias.  Neurological: Positive for light-headedness and headaches.       Objective:  BP 106/74  Pulse 74  Temp(Src) 98.2 F (36.8 C) (Oral)  Resp 18  Ht 5' 4.5" (1.638 m)  Wt 184 lb 6.4 oz (83.643 kg)  BMI 31.17 kg/m2  SpO2 98%  LMP 09/14/2013  Physical Exam  Nursing note and vitals reviewed. Constitutional: She is oriented to person, place, and time. She appears well-developed and well-nourished. No distress.  HENT:  Head: Normocephalic and atraumatic.  Eyes: Conjunctivae and EOM are normal.  Neck: Neck supple.  Cardiovascular: Normal rate.   Pulmonary/Chest: Effort normal.  Musculoskeletal: Normal range of motion.  2+ pedis pulses bilateral  Trace pedal edema TPP lateral right greater trochanter.  Neurological: She is alert and oriented to person, place, and time.  Skin: Skin is warm and dry.  Psychiatric: She has a normal mood and affect. Her behavior is normal.   Results for orders placed in visit on 10/15/13  POCT UA - MICROSCOPIC ONLY      Result Value Ref Range   WBC, Ur, HPF, POC 1-3     RBC, urine, microscopic 0-2     Bacteria, U Microscopic 2+     Mucus, UA positive     Epithelial cells, urine per micros 1-2     Crystals, Ur, HPF, POC neg     Casts, Ur, LPF, POC neg     Yeast, UA neg    POCT URINALYSIS DIPSTICK      Result Value Ref Range   Color, UA yellow     Clarity, UA slightly cloudy     Glucose, UA neg     Bilirubin, UA neg     Ketones, UA neg     Spec Grav, UA 1.020     Blood, UA trace-intact     pH, UA 5.5     Protein, UA neg     Urobilinogen, UA 0.2     Nitrite, UA neg     Leukocytes, UA Negative    POCT WET PREP WITH KOH      Result Value Ref Range   Trichomonas, UA Negative     Clue Cells Wet Prep HPF POC neg     Epithelial Wet Prep HPF POC 10-15     Yeast Wet  Prep HPF POC neg     Bacteria Wet Prep HPF POC 2+     RBC Wet Prep HPF POC 0-3     WBC Wet Prep HPF POC 2-4     KOH Prep POC Negative         Assessment & Plan:   Urinary body odor -  Plan: POCT UA - Microscopic Only, POCT urinalysis dipstick, POCT Wet Prep with KOH  Unspecified constipation - Plan: POCT UA - Microscopic Only, POCT urinalysis dipstick, POCT Wet Prep with KOH  Other malaise and fatigue - Plan: POCT UA - Microscopic Only, POCT urinalysis dipstick, POCT Wet Prep with KOH  Lightheaded - Plan: POCT UA - Microscopic Only, POCT urinalysis dipstick, POCT Wet Prep with KOH  Piriformis syndrome of right side - Plan: POCT UA - Microscopic Only, POCT urinalysis dipstick, POCT Wet Prep with KOH  Dehydration  Meds ordered this encounter  Medications  . levonorgestrel (MIRENA) 20 MCG/24HR IUD    Sig: 1 each by Intrauterine route once.    I personally performed the services described in this documentation, which was scribed in my presence. The recorded information has been reviewed and considered, and addended by me as needed.  Delman Cheadle, MD MPH

## 2013-10-15 NOTE — Telephone Encounter (Signed)
Noted. [facility] called at midnight. Stat lab nml.

## 2013-10-15 NOTE — Telephone Encounter (Signed)
Sharyn Lull, Why did we call her?

## 2013-10-16 ENCOUNTER — Telehealth: Payer: Self-pay | Admitting: Family Medicine

## 2013-10-16 NOTE — Telephone Encounter (Signed)
Dr. Brigitte Pulse, Anti inflammatory is making her sick with diarrhea multiple times a day. Feeling worse and has not taken it today. Has been taking medication with food. Please advise on what patient should do.    Best: [phone]

## 2013-10-16 NOTE — Progress Notes (Signed)
Scheduled an appointment with Dr.Shaw On Friday July 31 at 54 for follow up on hip and knee pain.

## 2013-10-17 MED ORDER — MELOXICAM 7.5 MG PO TABS: 7.5000 mg | ORAL_TABLET | Freq: Two times a day (BID) | ORAL | Status: DC | PRN

## 2013-10-17 NOTE — Telephone Encounter (Signed)
D/c nabumetone and don't take anything until she feel back to normal. Then when her GI sxs have resolved, try meloxicam instead with food which was sent to pharmacy. If anything that continues to make her feel worse, stop and come in for recheck.

## 2013-10-19 NOTE — Telephone Encounter (Signed)
Lm for rtn call 

## 2013-10-20 NOTE — Telephone Encounter (Signed)
lmom for pt to cb

## 2013-10-20 NOTE — Telephone Encounter (Signed)
Duplicate message. Pt advised.

## 2013-10-20 NOTE — Telephone Encounter (Signed)
Patient returned the call please call again  [phone]

## 2013-10-20 NOTE — Telephone Encounter (Signed)
Spoke to pt- she is taking the meloxicam and is doing better.

## 2013-11-06 ENCOUNTER — Ambulatory Visit: Payer: Self-pay | Admitting: Family Medicine

## 2013-11-07 ENCOUNTER — Ambulatory Visit (INDEPENDENT_AMBULATORY_CARE_PROVIDER_SITE_OTHER): Payer: No Typology Code available for payment source

## 2013-11-07 ENCOUNTER — Ambulatory Visit: Payer: No Typology Code available for payment source

## 2013-11-07 ENCOUNTER — Ambulatory Visit (INDEPENDENT_AMBULATORY_CARE_PROVIDER_SITE_OTHER): Payer: No Typology Code available for payment source | Admitting: Family Medicine

## 2013-11-07 VITALS — BP 108/74 | HR 75 | Temp 98.2°F | Resp 12 | Ht 64.5 in | Wt 185.0 lb

## 2013-11-07 DIAGNOSIS — M545 Low back pain, unspecified: Secondary | ICD-10-CM

## 2013-11-07 DIAGNOSIS — M5380 Other specified dorsopathies, site unspecified: Secondary | ICD-10-CM

## 2013-11-07 DIAGNOSIS — S134XXA Sprain of ligaments of cervical spine, initial encounter: Secondary | ICD-10-CM

## 2013-11-07 DIAGNOSIS — S139XXA Sprain of joints and ligaments of unspecified parts of neck, initial encounter: Secondary | ICD-10-CM

## 2013-11-07 DIAGNOSIS — M62830 Muscle spasm of back: Secondary | ICD-10-CM

## 2013-11-07 MED ORDER — MELOXICAM 7.5 MG PO TABS: 7.5000 mg | ORAL_TABLET | Freq: Two times a day (BID) | ORAL | Status: DC | PRN

## 2013-11-07 MED ORDER — HYDROCODONE-ACETAMINOPHEN 5-325 MG PO TABS: 1.0000 | ORAL_TABLET | Freq: Four times a day (QID) | ORAL | Status: DC | PRN

## 2013-11-07 MED ORDER — CYCLOBENZAPRINE HCL 10 MG PO TABS: 10.0000 mg | ORAL_TABLET | Freq: Three times a day (TID) | ORAL | Status: DC | PRN
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
## 2013-12-31 ENCOUNTER — Ambulatory Visit (INDEPENDENT_AMBULATORY_CARE_PROVIDER_SITE_OTHER): Payer: No Typology Code available for payment source | Admitting: Family Medicine

## 2013-12-31 VITALS — BP 104/71 | HR 78 | Temp 98.1°F | Resp 16 | Ht 65.0 in | Wt 189.2 lb

## 2013-12-31 DIAGNOSIS — R209 Unspecified disturbances of skin sensation: Secondary | ICD-10-CM

## 2013-12-31 DIAGNOSIS — R51 Headache: Secondary | ICD-10-CM

## 2013-12-31 DIAGNOSIS — R519 Headache, unspecified: Secondary | ICD-10-CM

## 2013-12-31 DIAGNOSIS — R202 Paresthesia of skin: Secondary | ICD-10-CM

## 2013-12-31 DIAGNOSIS — G935 Compression of brain: Secondary | ICD-10-CM

## 2013-12-31 NOTE — Patient Instructions (Signed)
Take Tylenol or ibuprofen or Mobic for headache  Take the gabapentin for headache and numbness sensation  In the event of sudden changing suddenly go to the emergency room  Tomorrow morning call the neurologist and try and see if you can get Dr. Maxie Barb or associates  Let me know if you're unable to get in with a neurologist. I will try and contact her also.

## 2013-12-31 NOTE — Progress Notes (Addendum)
Subjective: 23 year old lady presents with occipital headache. She's been having some left facial numbness since Monday for then the last several days has been having some numbness in the lateral right forearm and in the posterior right leg. She has continued to go to work and function. 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 She has known about this for a number of years, is seen by a neurologist. Has not had any nausea or vomiting. Last menstrual cycle was a couple of weeks ago. She has some gabapentin she takes her headaches but she has not taken any. She had an MRI this summer which did not show any significant changes. No visual complaints. No other HEENT complaints. Swallowing is good. Voice has been good. No cardiovascular complaints. No GI or GU complaints. Not no other musculoskeletal complaints.  Objective: Alert and oriented no gross abnormalities observed. Her TMs are normal. Eyes PERRLA. I did not get a great view her fundi. EOMs are intact. No nystagmus noted. Throat clear. Uvula midline. Neck supple without nodes. No carotid bruits. Chest clear. Heart regular without murmurs. Abdomen soft without mass or tenderness. Extremities are grossly normal. Cranial nerves 2-12 unremarkable. Motor strength symmetrical. Sensory grossly normal despite the areas of perceived paresthesia that I cannot document deficiency. Gait normal. Heel toe normal. Finger to nose normal. Negative Babinski. Romberg negative.  Assessment:  Chiari malformation I Facial paresthesia Right arm paresthesias Right leg paresthesia Headache  Plan: Contact neurology in the morning. I do not feel like this was an urgent issue that needed to go to the emergency room tonight, and he is urgent and needs to be seen tomorrow. It has been this way all week. She can take her gabapentin which may indeed not only reduce headaches might reduce the paresthesias. They understand emergency room if in all worse.  Friday,  9/25 According to [facility] the patient already spoke to the neurologist office this morning and has an appointment for Monday.

## 2014-01-01 ENCOUNTER — Telehealth: Payer: Self-pay

## 2014-01-01 ENCOUNTER — Telehealth: Payer: Self-pay | Admitting: Neurology

## 2014-01-01 NOTE — Telephone Encounter (Signed)
Patient calling to state that she was in the ER last night for headaches and face numbness, is wanting to schedule an appointment as soon as possible with DR. Athar per ER instructions, please call and advise.

## 2014-01-01 NOTE — Telephone Encounter (Signed)
Pt called to let Dr. Linna Darner know that she scheduled an appt with the neurologist this upcoming Monday.

## 2014-01-01 NOTE — Telephone Encounter (Signed)
Called patient and scheduled appt for 01/04/14 , appt. confirmed

## 2014-01-04 ENCOUNTER — Ambulatory Visit (INDEPENDENT_AMBULATORY_CARE_PROVIDER_SITE_OTHER): Payer: No Typology Code available for payment source | Admitting: Neurology

## 2014-01-04 ENCOUNTER — Other Ambulatory Visit: Payer: Self-pay | Admitting: Neurology

## 2014-01-04 ENCOUNTER — Encounter: Payer: Self-pay | Admitting: Neurology

## 2014-01-04 VITALS — Temp 98.9°F | Ht 65.0 in | Wt 193.0 lb

## 2014-01-04 DIAGNOSIS — G935 Compression of brain: Secondary | ICD-10-CM

## 2014-01-04 DIAGNOSIS — R51 Headache: Secondary | ICD-10-CM

## 2014-01-04 DIAGNOSIS — R42 Dizziness and giddiness: Secondary | ICD-10-CM

## 2014-01-04 MED ORDER — GABAPENTIN 300 MG PO CAPS: 300.0000 mg | ORAL_CAPSULE | Freq: Three times a day (TID) | ORAL | Status: DC | PRN

## 2014-01-04 MED ORDER — MECLIZINE HCL 25 MG PO TABS: 25.0000 mg | ORAL_TABLET | ORAL | Status: DC | PRN
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
## 2014-01-04 NOTE — Patient Instructions (Signed)
Take gabapentin 300 mg 2 times a day, up to 3 times a day for your headaches.  You may have carpal tunnel symptoms on the right.  You can use meclizine as needed.

## 2014-02-23 ENCOUNTER — Ambulatory Visit (INDEPENDENT_AMBULATORY_CARE_PROVIDER_SITE_OTHER): Payer: No Typology Code available for payment source | Admitting: Family Medicine

## 2014-02-23 VITALS — BP 124/74 | HR 95 | Temp 97.8°F | Resp 16 | Ht 65.0 in | Wt 191.8 lb

## 2014-02-23 DIAGNOSIS — J209 Acute bronchitis, unspecified: Secondary | ICD-10-CM

## 2014-02-23 MED ORDER — ALBUTEROL SULFATE (2.5 MG/3ML) 0.083% IN NEBU
2.5000 mg | INHALATION_SOLUTION | Freq: Once | RESPIRATORY_TRACT | Status: AC
  Administered 2014-02-23: 2.5 mg via RESPIRATORY_TRACT

## 2014-02-23 MED ORDER — GUAIFENESIN-CODEINE 100-10 MG/5ML PO SOLN: 5.0000 mL | ORAL | Status: DC | PRN

## 2014-02-23 MED ORDER — PSEUDOEPHEDRINE HCL ER 120 MG PO TB12: 120.0000 mg | ORAL_TABLET | Freq: Every day | ORAL | Status: DC | PRN

## 2014-02-23 MED ORDER — ALBUTEROL SULFATE HFA 108 (90 BASE) MCG/ACT IN AERS: 2.0000 | INHALATION_SPRAY | RESPIRATORY_TRACT | Status: DC | PRN

## 2014-02-23 MED ORDER — OXYMETAZOLINE HCL 0.05 % NA SOLN: 1.0000 | Freq: Two times a day (BID) | NASAL | Status: DC

## 2014-02-23 MED ORDER — AZITHROMYCIN 250 MG PO TABS: ORAL_TABLET | ORAL | Status: DC

## 2014-02-23 NOTE — Patient Instructions (Signed)

## 2014-02-23 NOTE — Progress Notes (Signed)
Subjective:    Patient ID: Crystal Howell, female    DOB: 1990/04/15, 23 y.o.   MRN: [id number] This chart was scribed for Delman Cheadle, MD by Marti Sleigh, Medical Scribe. This patient was seen in Room 5 and the patient's care was started a 11:01 AM.  Chief Complaint  Patient presents with  . Cough    x5 days; yellow sputum  . Shortness of Breath  . Sore Throat  . Nasal Congestion    with light blood when blowing nose    HPI  Past Medical History  Diagnosis Date  . Hx of menorrhagia   . Allergy   . Anemia   . Chiari malformation   . Headache(784.0)    Allergies  Allergen Reactions  . Doxycycline   . Erythromycin   . Nabumetone     Stomach upset   Current Outpatient Prescriptions on File Prior to Visit  Medication Sig Dispense Refill  . gabapentin (NEURONTIN) 300 MG capsule TAKE ONE CAPSULE BY MOUTH 3 TIMES A DAY AS NEEDED (FOR HEADACHES) 90 capsule 5  . levonorgestrel (MIRENA) 20 MCG/24HR IUD 1 each by Intrauterine route once.    . loratadine (CLARITIN) 10 MG tablet Take 10 mg by mouth daily.    . meclizine (ANTIVERT) 25 MG tablet Take 1 tablet (25 mg total) by mouth as needed for dizziness. Take one pill daily as needed for dizziness. 30 tablet 1  . meloxicam (MOBIC) 7.5 MG tablet Take 1 tablet (7.5 mg total) by mouth 2 (two) times daily as needed for pain. 60 tablet 1  . triamcinolone (NASACORT ALLERGY 24HR) 55 MCG/ACT AERO nasal inhaler Place 2 sprays into the nose daily.     No current facility-administered medications on file prior to visit.    HPI Comments: Crystal Howell is a 23 y.o. female who presents to [facility] complaining of cold symptoms that started five days ago. Pt endorses sweats, chills, congestion, sneezing, coughing, SOB, sore throat, light epistaxis and wheezing. Pt is taking sudafed, delsym, claritin, and nasacort daily. Pt denies fatigue or fever. Pt has used an inhaler in the past, but does not have one now. Pt has used a z-pac in the past without  side effects.  Pt has a hx of chiari 1 formation, and has presented previously with numerous occipital HA as well as left face and right extremity weakness. Pt was seen by neurology six weeks ago. Pt was advised to restart gabapentin twice daily and meclozine as needed, and recheck in six months. Her sx were not thought be related to any central neurological problem.    Review of Systems  Constitutional: Positive for chills and diaphoresis.  HENT: Positive for congestion, nosebleeds, rhinorrhea, sinus pressure, sneezing and sore throat.   Respiratory: Positive for cough, shortness of breath and wheezing.   Psychiatric/Behavioral: Negative for sleep disturbance.       Objective:  BP 124/74 mmHg  Pulse 95  Temp(Src) 97.8 F (36.6 C) (Oral)  Resp 16  Ht 5\' 5"  (1.651 m)  Wt 191 lb 12.8 oz (87 kg)  BMI 31.92 kg/m2  SpO2 98%  LMP 01/29/2014  Physical Exam  Constitutional: She is oriented to person, place, and time. She appears well-developed and well-nourished.  HENT:  Head: Normocephalic and atraumatic.  TMs with erythema bilaterally. Edema and fryable mucosa on septum bilaterally. Oropharinx with edema, no erythema or exudate. No frontal or maxillary sinus tenderness.  Eyes: Pupils are equal, round, and reactive to light.  Neck: Neck  supple. No thyromegaly present.  Cardiovascular: Normal rate and regular rhythm.   Pulmonary/Chest: Effort normal and breath sounds normal. No respiratory distress. She has no wheezes.  Decreased air movement throughout.   Lymphadenopathy:    She has cervical adenopathy.  No supraclavicular adenopathy.  Neurological: She is alert and oriented to person, place, and time.  Skin: Skin is warm and dry.  Psychiatric: She has a normal mood and affect. Her behavior is normal.  Nursing note and vitals reviewed.      Assessment & Plan:   Pt was advised to stop nasacort due to epistaxis. Pt was advised to begin afrin nasal spray and use for two days only.   Acute bronchitis, unspecified organism - Plan: albuterol (PROVENTIL) (2.5 MG/3ML) 0.083% nebulizer solution 2.5 mg  Meds ordered this encounter  Medications  . azithromycin (ZITHROMAX) 250 MG tablet    Sig: Take 2 tabs PO x 1 dose, then 1 tab PO QD x 4 days    Dispense:  6 tablet    Refill:  0  . pseudoephedrine (SUDAFED 12 HOUR) 120 MG 12 hr tablet    Sig: Take 1 tablet (120 mg total) by mouth daily as needed for congestion.    Dispense:  30 tablet    Refill:  0  . oxymetazoline (AFRIN NASAL SPRAY) 0.05 % nasal spray    Sig: Place 1 spray into both nostrils 2 (two) times daily. X 3d only    Dispense:  30 mL    Refill:  0  . guaiFENesin-codeine 100-10 MG/5ML syrup    Sig: Take 5 mLs by mouth every 4 (four) hours as needed for cough.    Dispense:  120 mL    Refill:  0  . albuterol (PROVENTIL HFA;VENTOLIN HFA) 108 (90 BASE) MCG/ACT inhaler    Sig: Inhale 2 puffs into the lungs every 4 (four) hours as needed for wheezing or shortness of breath (cough, shortness of breath or wheezing.).    Dispense:  1 Inhaler    Refill:  1  . albuterol (PROVENTIL) (2.5 MG/3ML) 0.083% nebulizer solution 2.5 mg    Sig:     I personally performed the services described in this documentation, which was scribed in my presence. The recorded information has been reviewed and considered, and addended by me as needed.  Delman Cheadle, MD MPH

## 2014-02-26 ENCOUNTER — Telehealth: Payer: Self-pay

## 2014-02-26 NOTE — Telephone Encounter (Signed)
Pt called. States she still has blood in drainage and is still having SOB several times a day. Please advise. CB # [phone]

## 2014-02-26 NOTE — Telephone Encounter (Signed)
Pt states she is still having bloody drainage when she blows her nose. She has used the nasal spray prescribed and the Afrin for the 4 day max. Advised pt to try a plain saline nasal spray. She complains she still has some shortness of breath with activity. Please advise further. Should pt RTC?

## 2014-02-26 NOTE — Telephone Encounter (Signed)
If she is worsening and having SHoB while on azithromycin antibiotic and using albuterol inhaler, she definitely needs to come back in to be seen asap - she will prob need a cbc and cxr to see if she needs a different antibiotic or a different type of inhaler or steroids, etc.

## 2014-02-27 ENCOUNTER — Ambulatory Visit (INDEPENDENT_AMBULATORY_CARE_PROVIDER_SITE_OTHER): Payer: No Typology Code available for payment source

## 2014-02-27 ENCOUNTER — Ambulatory Visit (INDEPENDENT_AMBULATORY_CARE_PROVIDER_SITE_OTHER): Payer: No Typology Code available for payment source | Admitting: Family Medicine

## 2014-02-27 VITALS — BP 100/66 | HR 90 | Temp 98.6°F | Resp 20 | Ht 65.0 in | Wt 193.2 lb

## 2014-02-27 DIAGNOSIS — R0609 Other forms of dyspnea: Secondary | ICD-10-CM

## 2014-02-27 DIAGNOSIS — J209 Acute bronchitis, unspecified: Secondary | ICD-10-CM

## 2014-02-27 DIAGNOSIS — R059 Cough, unspecified: Secondary | ICD-10-CM

## 2014-02-27 DIAGNOSIS — R0600 Dyspnea, unspecified: Secondary | ICD-10-CM

## 2014-02-27 DIAGNOSIS — R05 Cough: Secondary | ICD-10-CM

## 2014-02-27 MED ORDER — PREDNISONE 20 MG PO TABS: 40.0000 mg | ORAL_TABLET | Freq: Every day | ORAL | Status: DC

## 2014-02-27 NOTE — Progress Notes (Addendum)
Subjective:    Patient ID: Crystal Howell, female    DOB: December 10, 1990, 23 y.o.   MRN: [id number] This chart was scribed for Dr. Robyn Haber by Steva Colder, ED Scribe. The patient was seen in room 12 at 11:17 AM.   Chief Complaint  Patient presents with  . Shortness of Breath    Seen on Tues. dx bronchitis, still having SOB and cough up blood tinged phelgm    HPI Crystal Howell is a 23 y.o. female who presents today complaining of SOB. She reports that she was seen on Tuesday and was told to f/u if she still have SOB for a CXR. She reports that she was dx with bronchitis on Tuesday. The SOB is exacerbated with walking, and it doesn't take much to make it occur. She states that she is having associated symptoms of SOB, nosebleeds, and productive cough. her cough is productive of blood and phelgm She denies abdominal pain, back pain, and any other associated symptoms. 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 The pt voices concern for if this is what caused her current symptoms.      Patient Active Problem List   Diagnosis Date Noted  . Allergic rhinitis 12/12/2011  . Overweight 10/31/2011   Past Medical History  Diagnosis Date  . Hx of menorrhagia   . Allergy   . Anemia   . Chiari malformation   . BSJGGEZM(629.4)    Past Surgical History  Procedure Laterality Date  . Wisdom tooth extraction     Allergies  Allergen Reactions  . Doxycycline   . Erythromycin   . Nabumetone     Stomach upset   Prior to Admission medications   Medication Sig Start Date End Date Taking? Authorizing Provider  albuterol (PROVENTIL HFA;VENTOLIN HFA) 108 (90 BASE) MCG/ACT inhaler Inhale 2 puffs into the lungs every 4 (four) hours as needed for wheezing or shortness of breath (cough, shortness of breath or wheezing.). 02/23/14  Yes Shawnee Knapp, MD  azithromycin (ZITHROMAX) 250 MG tablet Take 2 tabs PO x 1 dose,  then 1 tab PO QD x 4 days 02/23/14  Yes Shawnee Knapp, MD  gabapentin (NEURONTIN) 300 MG capsule TAKE ONE CAPSULE BY MOUTH 3 TIMES A DAY AS NEEDED (FOR HEADACHES) 01/05/14  Yes Star Age, MD  guaiFENesin-codeine 100-10 MG/5ML syrup Take 5 mLs by mouth every 4 (four) hours as needed for cough. 02/23/14  Yes Shawnee Knapp, MD  levonorgestrel (MIRENA) 20 MCG/24HR IUD 1 each by Intrauterine route once.   Yes Historical Provider, MD  loratadine (CLARITIN) 10 MG tablet Take 10 mg by mouth daily.   Yes Historical Provider, MD  meclizine (ANTIVERT) 25 MG tablet Take 1 tablet (25 mg total) by mouth as needed for dizziness. Take one pill daily as needed for dizziness. 01/04/14  Yes Star Age, MD  meloxicam (MOBIC) 7.5 MG tablet Take 1 tablet (7.5 mg total) by mouth 2 (two) times daily as needed for pain. 11/07/13  Yes Shawnee Knapp, MD  oxymetazoline (AFRIN NASAL SPRAY) 0.05 % nasal spray Place 1 spray into both nostrils 2 (two) times daily. X 3d only 02/23/14  Yes Shawnee Knapp, MD  pseudoephedrine (SUDAFED 12 HOUR) 120 MG 12 hr tablet Take 1 tablet (120 mg total) by mouth daily as needed for congestion. 02/23/14  Yes Shawnee Knapp, MD      Review  of Systems  HENT: Positive for nosebleeds.   Respiratory: Positive for cough and shortness of breath.   Gastrointestinal: Negative for abdominal pain.  Musculoskeletal: Negative for back pain.  All other systems reviewed and are negative.      Objective:   Physical Exam  Constitutional: She is oriented to person, place, and time. She appears well-developed and well-nourished. No distress.  HENT:  Head: Normocephalic and atraumatic.  Mouth/Throat: Oropharynx is clear and moist and mucous membranes are normal.  Left nasal septum is hyperemic and friable.   Eyes: EOM are normal.  Neck: Neck supple. No tracheal deviation present.  Cardiovascular: Normal rate.   Pulmonary/Chest: Effort normal. No respiratory distress.  Musculoskeletal: Normal range of motion.    Neurological: She is alert and oriented to person, place, and time.  Skin: Skin is warm and dry.  Psychiatric: She has a normal mood and affect. Her behavior is normal.  Nursing note and vitals reviewed. [facility] reading (PRIMARY) by  Dr. Joseph Art  CXR:  negative.      BP 100/66 mmHg  Pulse 90  Temp(Src) 98.6 F (37 C) (Oral)  Resp 20  Ht 5\' 5"  (1.651 m)  Wt 193 lb 4 oz (87.658 kg)  BMI 32.16 kg/m2  SpO2 99%  LMP 01/29/2014  Assessment & Plan:  DIAGNOSTIC STUDIES: Oxygen Saturation is 99% on room air, normal by my interpretation.    COORDINATION OF CARE: 11:23 AM-Discussed treatment plan which includes CXR with pt at bedside and pt agreed to plan.    I personally performed the services described in this documentation, which was scribed in my presence. The recorded information has been reviewed and is accurate.  Cough - Plan: DG Chest 2 View, predniSONE (DELTASONE) 20 MG tablet  DOE (dyspnea on exertion) - Plan: predniSONE (DELTASONE) 20 MG tablet  Acute bronchitis, unspecified organism - Plan: predniSONE (DELTASONE) 20 MG tablet  Signed, Robyn Haber, MD

## 2014-02-27 NOTE — Telephone Encounter (Signed)
Pt advised to RTC.

## 2014-02-27 NOTE — Patient Instructions (Signed)

## 2014-03-08 ENCOUNTER — Telehealth: Payer: Self-pay | Admitting: Family Medicine

## 2014-03-08 NOTE — Telephone Encounter (Signed)
Opened in errror 

## 2014-05-15 ENCOUNTER — Ambulatory Visit (INDEPENDENT_AMBULATORY_CARE_PROVIDER_SITE_OTHER): Payer: BLUE CROSS/BLUE SHIELD

## 2014-05-15 ENCOUNTER — Ambulatory Visit (INDEPENDENT_AMBULATORY_CARE_PROVIDER_SITE_OTHER): Payer: BLUE CROSS/BLUE SHIELD | Admitting: Emergency Medicine

## 2014-05-15 VITALS — BP 110/70 | HR 117 | Temp 98.7°F | Resp 16 | Ht 66.0 in | Wt 200.0 lb

## 2014-05-15 DIAGNOSIS — M25561 Pain in right knee: Secondary | ICD-10-CM

## 2014-05-15 MED ORDER — NAPROXEN SODIUM 550 MG PO TABS: 550.0000 mg | ORAL_TABLET | Freq: Two times a day (BID) | ORAL | Status: DC

## 2014-05-15 NOTE — Patient Instructions (Signed)

## 2014-05-15 NOTE — Progress Notes (Signed)
[facility] [address], [place] [address] [phone]  Date:  05/15/2014   Name:  Crystal Howell   DOB:  26-Dec-1990   MRN:  [id number]  PCP:  Johnnette Barrios, RN    Chief Complaint: Knee Pain   History of Present Illness:  Crystal Howell is a 24 y.o. very pleasant female patient who presents with the following:  No history of injury.  Has pain and swelling in right knee for this week No overuse or antecedent illness. No redness or warmth.   Pain increases with standing and forced flexion. Non radiating.  Works at data entry No improvement with over the counter medications or other home remedies.  Denies other complaint or health concern today.   Patient Active Problem List   Diagnosis Date Noted  . Allergic rhinitis 12/12/2011  . Overweight 10/31/2011    Past Medical History  Diagnosis Date  . Hx of menorrhagia   . Allergy   . Anemia   . Chiari malformation   . AOZHYQMV(784.6)     Past Surgical History  Procedure Laterality Date  . Wisdom tooth extraction      History  Substance Use Topics  . Smoking status: Never Smoker   . Smokeless tobacco: Never Used  . Alcohol Use: No    Family History  Problem Relation Age of Onset  . Thyroid disease Mother   . Anemia Mother   . Heart disease Maternal Grandmother   . Emphysema Maternal Grandmother   . Stroke Maternal Grandmother   . Mental illness Maternal Grandmother   . Lung cancer Maternal Grandfather   . Liver disease Father   . Cancer Paternal Aunt     Allergies  Allergen Reactions  . Doxycycline   . Erythromycin   . Nabumetone     Stomach upset    Medication list has been reviewed and updated.  Current Outpatient Prescriptions on File Prior to Visit  Medication Sig Dispense Refill  . albuterol (PROVENTIL HFA;VENTOLIN HFA) 108 (90 BASE) MCG/ACT inhaler Inhale 2 puffs into the lungs every 4 (four) hours as needed for wheezing or shortness of breath (cough,  shortness of breath or wheezing.). 1 Inhaler 1  . gabapentin (NEURONTIN) 300 MG capsule TAKE ONE CAPSULE BY MOUTH 3 TIMES A DAY AS NEEDED (FOR HEADACHES) 90 capsule 5  . levonorgestrel (MIRENA) 20 MCG/24HR IUD 1 each by Intrauterine route once.    . loratadine (CLARITIN) 10 MG tablet Take 10 mg by mouth daily.    . meclizine (ANTIVERT) 25 MG tablet Take 1 tablet (25 mg total) by mouth as needed for dizziness. Take one pill daily as needed for dizziness. 30 tablet 1  . meloxicam (MOBIC) 7.5 MG tablet Take 1 tablet (7.5 mg total) by mouth 2 (two) times daily as needed for pain. 60 tablet 1  . oxymetazoline (AFRIN NASAL SPRAY) 0.05 % nasal spray Place 1 spray into both nostrils 2 (two) times daily. X 3d only (Patient not taking: Reported on 05/15/2014) 30 mL 0   No current facility-administered medications on file prior to visit.    Review of Systems:  As per HPI, otherwise negative.    Physical Examination: Filed Vitals:   05/15/14 1517  BP: 110/70  Pulse: 117  Temp: 98.7 F (37.1 C)  Resp: 16   Filed Vitals:   05/15/14 1517  Height: 5\' 6"  (1.676 m)  Weight: 200 lb (90.719 kg)   Body mass index is 32.3 kg/(m^2). Ideal  Body Weight: Weight in (lb) to have BMI = 25: 154.6   GEN: WDWN, NAD, Non-toxic, Alert & Oriented x 3 HEENT: Atraumatic, Normocephalic.  Ears and Nose: No external deformity. EXTR: No clubbing/cyanosis/edema NEURO: Normal gait.  PSYCH: Normally interactive. Conversant. Not depressed or anxious appearing.  Calm demeanor.  Right knee:  Not warm.  Minimal effusion.  Full painless ROM, joint stable  Assessment and Plan: Knee strain Anaprox RICE Follow up if needed   Signed,  Ellison Carwin, MD   [facility] reading (PRIMARY) by  Dr. Ouida Sills.  Negative knee.

## 2014-06-16 ENCOUNTER — Telehealth: Payer: Self-pay

## 2014-06-25 NOTE — Telephone Encounter (Signed)
Left message again to call and reschedule

## 2014-07-05 ENCOUNTER — Ambulatory Visit: Payer: No Typology Code available for payment source | Admitting: Neurology

## 2014-08-18 ENCOUNTER — Ambulatory Visit (INDEPENDENT_AMBULATORY_CARE_PROVIDER_SITE_OTHER): Payer: No Typology Code available for payment source | Admitting: Family Medicine

## 2014-08-18 ENCOUNTER — Ambulatory Visit: Payer: BLUE CROSS/BLUE SHIELD

## 2014-08-18 VITALS — BP 100/74 | HR 81 | Temp 98.1°F | Resp 16 | Ht 65.0 in | Wt 205.4 lb

## 2014-08-18 DIAGNOSIS — Z131 Encounter for screening for diabetes mellitus: Secondary | ICD-10-CM

## 2014-08-18 DIAGNOSIS — K219 Gastro-esophageal reflux disease without esophagitis: Secondary | ICD-10-CM | POA: Diagnosis not present

## 2014-08-18 DIAGNOSIS — N390 Urinary tract infection, site not specified: Secondary | ICD-10-CM

## 2014-08-18 DIAGNOSIS — M7989 Other specified soft tissue disorders: Secondary | ICD-10-CM

## 2014-08-18 DIAGNOSIS — R300 Dysuria: Secondary | ICD-10-CM | POA: Diagnosis not present

## 2014-08-18 LAB — POCT UA - MICROSCOPIC ONLY
CASTS, UR, LPF, POC: NEGATIVE
Crystals, Ur, HPF, POC: NEGATIVE
Mucus, UA: NEGATIVE
Yeast, UA: NEGATIVE

## 2014-08-18 LAB — POCT URINALYSIS DIPSTICK
Bilirubin, UA: NEGATIVE
Glucose, UA: NEGATIVE
KETONES UA: NEGATIVE
Nitrite, UA: NEGATIVE
PH UA: 6
PROTEIN UA: NEGATIVE
Urobilinogen, UA: 0.2

## 2014-08-18 LAB — POCT CBC
Granulocyte percent: 51.9 %G (ref 37–80)
HCT, POC: 41.7 % (ref 37.7–47.9)
Hemoglobin: 13.2 g/dL (ref 12.2–16.2)
Lymph, poc: 2.7 (ref 0.6–3.4)
MCH, POC: 29.5 pg (ref 27–31.2)
MCHC: 31.6 g/dL — AB (ref 31.8–35.4)
MCV: 93.1 fL (ref 80–97)
MID (cbc): 0.3 (ref 0–0.9)
MPV: 7.1 fL (ref 0–99.8)
POC Granulocyte: 3.2 (ref 2–6.9)
POC LYMPH PERCENT: 43.2 %L (ref 10–50)
POC MID %: 4.9 %M (ref 0–12)
Platelet Count, POC: 283 10*3/uL (ref 142–424)
RBC: 4.48 M/uL (ref 4.04–5.48)
RDW, POC: 15.6 %
WBC: 6.2 10*3/uL (ref 4.6–10.2)

## 2014-08-18 LAB — POCT GLYCOSYLATED HEMOGLOBIN (HGB A1C): Hemoglobin A1C: 5.2

## 2014-08-18 MED ORDER — CIPROFLOXACIN HCL 500 MG PO TABS: 500.0000 mg | ORAL_TABLET | Freq: Two times a day (BID) | ORAL | Status: DC

## 2014-08-18 NOTE — Progress Notes (Signed)
Chief Complaint:  Chief Complaint  Patient presents with  . Dysuria  . Left Side Pain  . swelling in feet    HPI: Crystal Howell is a 24 y.o. female who is here for  1. Burning sensation today, left flank pain since Monday. She has a hx of UTI in the past. She  Denies fevers, chills, nausea, vomiting. She also has had GERD sxs.  2. Some swelling and tingling in both feet , more in right sometime than left. She has this for soemtime now, sometimes even after she wakes up and has had her feet elevated.  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 She doe snot get up to walk much, she is fairly sedentary. No calf pain or assymetrical swelling.  She deneis diabetes but has never been tested. Low/no risk factors of PE/DVT. Denies recent trauma/surgeires, hx of DVT/PE, long car or plane rides, or malignancy   Past Medical History  Diagnosis Date  . Hx of menorrhagia   . Allergy   . Anemia   . Chiari malformation   . Headache(784.0)   . Fibroids    Past Surgical History  Procedure Laterality Date  . Wisdom tooth extraction     History   Social History  . Marital Status: Single    Spouse Name: N/A  . Number of Children: 0  . Years of Education: 16   Occupational History  .      [employer]   Social History Main Topics  . Smoking status: Never Smoker   . Smokeless tobacco: Never Used  . Alcohol Use: No  . Drug Use: No  . Sexual Activity: Yes    Birth Control/ Protection: Pill   Other Topics Concern  . None   Social History Narrative   Patient resides with mother, consumes rarely caffeine   Family History  Problem Relation Age of Onset  . Thyroid disease Mother   . Anemia Mother   . Heart disease Maternal Grandmother   . Emphysema Maternal Grandmother   . Stroke Maternal Grandmother   . Mental illness Maternal Grandmother   . Lung cancer Maternal Grandfather   . Liver disease Father   . Cancer Paternal Aunt    Allergies  Allergen  Reactions  . Doxycycline   . Erythromycin   . Nabumetone     Stomach upset   Prior to Admission medications   Medication Sig Start Date End Date Taking? Authorizing Provider  albuterol (PROVENTIL HFA;VENTOLIN HFA) 108 (90 BASE) MCG/ACT inhaler Inhale 2 puffs into the lungs every 4 (four) hours as needed for wheezing or shortness of breath (cough, shortness of breath or wheezing.). 02/23/14  Yes Shawnee Knapp, MD  gabapentin (NEURONTIN) 300 MG capsule TAKE ONE CAPSULE BY MOUTH 3 TIMES A DAY AS NEEDED (FOR HEADACHES) 01/05/14  Yes Star Age, MD  levonorgestrel (MIRENA) 20 MCG/24HR IUD 1 each by Intrauterine route once.   Yes Historical Provider, MD  loratadine (CLARITIN) 10 MG tablet Take 10 mg by mouth daily.   Yes Historical Provider, MD  meclizine (ANTIVERT) 25 MG tablet Take 1 tablet (25 mg total) by mouth as needed for dizziness. Take one pill daily as needed for dizziness. 01/04/14  Yes Star Age, MD     ROS: The patient denies fevers, chills, night sweats, unintentional weight loss, chest pain, palpitations, wheezing, dyspnea on exertion, nausea, vomiting,  hematuria, melena,  weakness  All other systems have been reviewed and  were otherwise negative with the exception of those mentioned in the HPI and as above.    PHYSICAL EXAM: Filed Vitals:   08/18/14 1807  BP: 100/74  Pulse: 81  Temp: 98.1 F (36.7 C)  Resp: 16   Filed Vitals:   08/18/14 1807  Height: 5\' 5"  (1.651 m)  Weight: 205 lb 6 oz (93.157 kg)   Body mass index is 34.18 kg/(m^2).  General: Alert, no acute distress HEENT:  Normocephalic, atraumatic, oropharynx patent. EOMI, PERRLA Cardiovascular:  Regular rate and rhythm, no rubs murmurs or gallops.  No Carotid bruits, radial pulse intact.  Respiratory: Clear to auscultation bilaterally.  No wheezes, rales, or rhonchi.  No cyanosis, no use of accessory musculature GI: No organomegaly, abdomen is soft and non-tender, positive bowel sounds.  No masses. Skin: No  rashes. Neurologic: Facial musculature symmetric. Psychiatric: Patient is appropriate throughout our interaction. Lymphatic: No cervical lymphadenopathy Musculoskeletal: Gait intact. + CVA tenderness Full ROM UE and LE  5/5 strength, 2/2 DTRs No saddle anesthesia Neg straight leg No asymmetery of calf, neg Homan, she has none to minimal foot swelling.      LABS: Results for orders placed or performed in visit on 08/18/14  POCT UA - Microscopic Only  Result Value Ref Range   WBC, Ur, HPF, POC 10-15    RBC, urine, microscopic 2-4    Bacteria, U Microscopic 1+    Mucus, UA neg    Epithelial cells, urine per micros 3-6    Crystals, Ur, HPF, POC neg    Casts, Ur, LPF, POC neg    Yeast, UA neg   POCT urinalysis dipstick  Result Value Ref Range   Color, UA yellow    Clarity, UA hazy    Glucose, UA neg    Bilirubin, UA neg    Ketones, UA neg    Spec Grav, UA <=1.005    Blood, UA small    pH, UA 6.0    Protein, UA neg    Urobilinogen, UA 0.2    Nitrite, UA neg    Leukocytes, UA moderate (2+)   POCT CBC  Result Value Ref Range   WBC 6.2 4.6 - 10.2 K/uL   Lymph, poc 2.7 0.6 - 3.4   POC LYMPH PERCENT 43.2 10 - 50 %L   MID (cbc) 0.3 0 - 0.9   POC MID % 4.9 0 - 12 %M   POC Granulocyte 3.2 2 - 6.9   Granulocyte percent 51.9 37 - 80 %G   RBC 4.48 4.04 - 5.48 M/uL   Hemoglobin 13.2 12.2 - 16.2 g/dL   HCT, POC 41.7 37.7 - 47.9 %   MCV 93.1 80 - 97 fL   MCH, POC 29.5 27 - 31.2 pg   MCHC 31.6 (A) 31.8 - 35.4 g/dL   RDW, POC 15.6 %   Platelet Count, POC 283 142 - 424 K/uL   MPV 7.1 0 - 99.8 fL     EKG/XRAY:   Primary read interpreted by Dr. Marin Comment at [facility].   ASSESSMENT/PLAN: Encounter Diagnoses  Name Primary?  . Dysuria Yes  . Gastroesophageal reflux disease without esophagitis   . Urinary tract infection, acute   . Swelling of lower extremity    Pleasant slightly obese AA female with UTI and also ongoing numbness/tingling of her feet ? Etiology of numbness and  tingling, I suspect she is wearing tight shoes and also is sedentary and needs to wear bigger shoes and also walk around when she is  working and take breaks.  Will check CMP for renal and electrolyte function, lipase for pedal and also occ midepigastric painlongstanding Rx cipro for UTI Urine culture pending Labs pending Fu prn   Gross sideeffects, risk and benefits, and alternatives of medications d/w patient. Patient is aware that all medications have potential sideeffects and we are unable to predict every sideeffect or drug-drug interaction that may occur.  LE, [place], DO 08/18/2014 8:15 PM

## 2014-08-18 NOTE — Patient Instructions (Signed)
Urinary Tract Infection °Urinary tract infections (UTIs) can develop anywhere along your urinary tract. Your urinary tract is your body's drainage system for removing wastes and extra water. Your urinary tract includes two kidneys, two ureters, a bladder, and a urethra. Your kidneys are a pair of bean-shaped organs. Each kidney is about the size of your fist. They are located below your ribs, one on each side of your spine. °CAUSES °Infections are caused by microbes, which are microscopic organisms, including fungi, viruses, and bacteria. These organisms are so small that they can only be seen through a microscope. Bacteria are the microbes that most commonly cause UTIs. °SYMPTOMS  °Symptoms of UTIs may vary by age and gender of the patient and by the location of the infection. Symptoms in young women typically include a frequent and intense urge to urinate and a painful, burning feeling in the bladder or urethra during urination. Older women and men are more likely to be tired, shaky, and weak and have muscle aches and abdominal pain. A fever may mean the infection is in your kidneys. Other symptoms of a kidney infection include pain in your back or sides below the ribs, nausea, and vomiting. °DIAGNOSIS °To diagnose a UTI, your caregiver will ask you about your symptoms. Your caregiver also will ask to provide a urine sample. The urine sample will be tested for bacteria and white blood cells. White blood cells are made by your body to help fight infection. °TREATMENT  °Typically, UTIs can be treated with medication. Because most UTIs are caused by a bacterial infection, they usually can be treated with the use of antibiotics. The choice of antibiotic and length of treatment depend on your symptoms and the type of bacteria causing your infection. °HOME CARE INSTRUCTIONS °· If you were prescribed antibiotics, take them exactly as your caregiver instructs you. Finish the medication even if you feel better after you  have only taken some of the medication. °· Drink enough water and fluids to keep your urine clear or pale yellow. °· Avoid caffeine, tea, and carbonated beverages. They tend to irritate your bladder. °· Empty your bladder often. Avoid holding urine for long periods of time. °· Empty your bladder before and after sexual intercourse. °· After a bowel movement, women should cleanse from front to back. Use each tissue only once. °SEEK MEDICAL CARE IF:  °· You have back pain. °· You develop a fever. °· Your symptoms do not begin to resolve within 3 days. °SEEK IMMEDIATE MEDICAL CARE IF:  °· You have severe back pain or lower abdominal pain. °· You develop chills. °· You have nausea or vomiting. °· You have continued burning or discomfort with urination. °MAKE SURE YOU:  °· Understand these instructions. °· Will watch your condition. °· Will get help right away if you are not doing well or get worse. °Document Released: 01/03/2005 Document Revised: 09/25/2011 Document Reviewed: 05/04/2011 °[employer]® Patient Information ©2015 [employer]. This information is not intended to replace advice given to you by your health care provider. Make sure you discuss any questions you have with your health care provider. ° °

## 2014-08-20 LAB — COMPREHENSIVE METABOLIC PANEL
Albumin/Globulin Ratio: 1.7 (ref 1.1–2.5)
Albumin: 4.5 g/dL (ref 3.5–5.5)
BUN/Creatinine Ratio: 11 (ref 8–20)
BUN: 8 mg/dL (ref 6–20)
Bilirubin Total: 0.6 mg/dL (ref 0.0–1.2)
Calcium: 9.6 mg/dL (ref 8.7–10.2)
Sodium: 141 mmol/L (ref 134–144)
Total Protein: 7.1 g/dL (ref 6.0–8.5)

## 2014-08-20 LAB — COMPREHENSIVE METABOLIC PANEL WITH GFR
ALT: 24 IU/L (ref 0–32)
AST: 22 IU/L (ref 0–40)
Alkaline Phosphatase: 47 IU/L (ref 39–117)
CO2: 22 mmol/L (ref 18–29)
Chloride: 101 mmol/L (ref 97–108)
Creatinine, Ser: 0.7 mg/dL (ref 0.57–1.00)
GFR calc Af Amer: 139 mL/min/{1.73_m2} (ref >59)
GFR calc non Af Amer: 121 mL/min/{1.73_m2} (ref >59)
Globulin, Total: 2.6 g/dL (ref 1.5–4.5)
Glucose: 75 mg/dL (ref 65–99)
Potassium: 4.1 mmol/L (ref 3.5–5.2)

## 2014-08-20 LAB — LIPASE: Lipase: 31 U/L (ref 0–59)

## 2014-08-20 LAB — URINE CULTURE

## 2014-08-24 ENCOUNTER — Telehealth: Payer: Self-pay

## 2014-08-24 NOTE — Telephone Encounter (Signed)
Pt states she has been taking meds but still has diarrhea and pain L side   Best phone for pt is [phone]   Pharmacy [facility]

## 2014-08-24 NOTE — Telephone Encounter (Signed)
LM about labs, stop Cipro, urine cx was nonspecific, needs to return to get wet prep and cervical exam if still having left side pain, does have hx of fibroids.

## 2014-08-25 ENCOUNTER — Telehealth: Payer: Self-pay

## 2014-08-25 NOTE — Telephone Encounter (Signed)
Do you want patient to come in for pap, or wait when she has the ultrasound with her OB/GYN on June 1? Dr. Marin Comment  [phone]

## 2014-08-26 NOTE — Telephone Encounter (Signed)
Spoke with patient last night, she is better but still has some mild sxs, she is scheduled to have an [place] for fibroids on June 1st. i have advised her to come in for recheck of urine and laso cervical exam if warranted. If she needs pap then will do that at same time if pap is not UTD since only getting [place] at ob/gyn.

## 2014-11-17 ENCOUNTER — Ambulatory Visit (INDEPENDENT_AMBULATORY_CARE_PROVIDER_SITE_OTHER): Payer: 59 | Admitting: Physician Assistant

## 2014-11-17 VITALS — BP 110/60 | HR 94 | Temp 98.4°F | Resp 16 | Ht 65.0 in | Wt 201.0 lb

## 2014-11-17 DIAGNOSIS — R05 Cough: Secondary | ICD-10-CM | POA: Diagnosis not present

## 2014-11-17 DIAGNOSIS — R058 Other specified cough: Secondary | ICD-10-CM

## 2014-11-17 MED ORDER — HYDROCOD POLST-CPM POLST ER 10-8 MG/5ML PO SUER: 5.0000 mL | Freq: Every evening | ORAL | Status: AC | PRN

## 2014-11-17 MED ORDER — AZITHROMYCIN 250 MG PO TABS: ORAL_TABLET | ORAL | Status: AC

## 2014-11-17 MED ORDER — GUAIFENESIN ER 1200 MG PO TB12: 1.0000 | ORAL_TABLET | Freq: Two times a day (BID) | ORAL | Status: DC | PRN

## 2014-11-17 NOTE — Patient Instructions (Addendum)
Upper Respiratory Infection, Adult An upper respiratory infection (URI) is also sometimes known as the common cold. The upper respiratory tract includes the nose, sinuses, throat, trachea, and bronchi. Bronchi are the airways leading to the lungs. Most people improve within 1 week, but symptoms can last up to 2 weeks. A residual cough may last even longer.  CAUSES Many different viruses can infect the tissues lining the upper respiratory tract. The tissues become irritated and inflamed and often become very moist. Mucus production is also common. A cold is contagious. You can easily spread the virus to others by oral contact. This includes kissing, sharing a glass, coughing, or sneezing. Touching your mouth or nose and then touching a surface, which is then touched by another person, can also spread the virus. SYMPTOMS  Symptoms typically develop 1 to 3 days after you come in contact with a cold virus. Symptoms vary from person to person. They may include:  Runny nose.  Sneezing.  Nasal congestion.  Sinus irritation.  Sore throat.  Loss of voice (laryngitis).  Cough.  Fatigue.  Muscle aches.  Loss of appetite.  Headache.  Low-grade fever. DIAGNOSIS  You might diagnose your own cold based on familiar symptoms, since most people get a cold 2 to 3 times a year. Your caregiver can confirm this based on your exam. Most importantly, your caregiver can check that your symptoms are not due to another disease such as strep throat, sinusitis, pneumonia, asthma, or epiglottitis. Blood tests, throat tests, and X-rays are not necessary to diagnose a common cold, but they may sometimes be helpful in excluding other more serious diseases. Your caregiver will decide if any further tests are required. RISKS AND COMPLICATIONS  You may be at risk for a more severe case of the common cold if you smoke cigarettes, have chronic heart disease (such as heart failure) or lung disease (such as asthma), or if  you have a weakened immune system. The very young and very old are also at risk for more serious infections. Bacterial sinusitis, middle ear infections, and bacterial pneumonia can complicate the common cold. The common cold can worsen asthma and chronic obstructive pulmonary disease (COPD). Sometimes, these complications can require emergency medical care and may be life-threatening. PREVENTION  The best way to protect against getting a cold is to practice good hygiene. Avoid oral or hand contact with people with cold symptoms. Wash your hands often if contact occurs. There is no clear evidence that vitamin C, vitamin E, echinacea, or exercise reduces the chance of developing a cold. However, it is always recommended to get plenty of rest and practice good nutrition. TREATMENT  Treatment is directed at relieving symptoms. There is no cure. Antibiotics are not effective, because the infection is caused by a virus, not by bacteria. Treatment may include:  Increased fluid intake. Sports drinks offer valuable electrolytes, sugars, and fluids.  Breathing heated mist or steam (vaporizer or shower).  Eating chicken soup or other clear broths, and maintaining good nutrition.  Getting plenty of rest.  Using gargles or lozenges for comfort.  Controlling fevers with ibuprofen or acetaminophen as directed by your caregiver.  Increasing usage of your inhaler if you have asthma. Zinc gel and zinc lozenges, taken in the first 24 hours of the common cold, can shorten the duration and lessen the severity of symptoms. Pain medicines may help with fever, muscle aches, and throat pain. A variety of non-prescription medicines are available to treat congestion and runny nose. Your caregiver   can make recommendations and may suggest nasal or lung inhalers for other symptoms.  HOME CARE INSTRUCTIONS   Only take over-the-counter or prescription medicines for pain, discomfort, or fever as directed by your  caregiver.  Use a warm mist humidifier or inhale steam from a shower to increase air moisture. This may keep secretions moist and make it easier to breathe.  Drink enough water and fluids to keep your urine clear or pale yellow.  Rest as needed.  Return to work when your temperature has returned to normal or as your caregiver advises. You may need to stay home longer to avoid infecting others. You can also use a face mask and careful hand washing to prevent spread of the virus. SEEK MEDICAL CARE IF:   After the first few days, you feel you are getting worse rather than better.  You need your caregiver's advice about medicines to control symptoms.  You develop chills, worsening shortness of breath, or brown or red sputum. These may be signs of pneumonia.  You develop yellow or brown nasal discharge or pain in the face, especially when you bend forward. These may be signs of sinusitis.  You develop a fever, swollen neck glands, pain with swallowing, or white areas in the back of your throat. These may be signs of strep throat. SEEK IMMEDIATE MEDICAL CARE IF:   You have a fever.  You develop severe or persistent headache, ear pain, sinus pain, or chest pain.  You develop wheezing, a prolonged cough, cough up blood, or have a change in your usual mucus (if you have chronic lung disease).  You develop sore muscles or a stiff neck. Document Released: 09/19/2000 Document Revised: 06/18/2011 Document Reviewed: 07/01/2013 [facility] Patient Information 2015 [place], [place]. This information is not intended to replace advice given to you by your health care provider. Make sure you discuss any questions you have with your health care provider.  Food Choices for Gastroesophageal Reflux Disease When you have gastroesophageal reflux disease (GERD), the foods you eat and your eating habits are very important. Choosing the right foods can help ease the discomfort of GERD. WHAT GENERAL GUIDELINES DO I  NEED TO FOLLOW?  Choose fruits, vegetables, whole grains, low-fat dairy products, and low-fat meat, fish, and poultry.  Limit fats such as oils, salad dressings, butter, nuts, and avocado.  Keep a food diary to identify foods that cause symptoms.  Avoid foods that cause reflux. These may be different for different people.  Eat frequent small meals instead of three large meals each day.  Eat your meals slowly, in a relaxed setting.  Limit fried foods.  Cook foods using methods other than frying.  Avoid drinking alcohol.  Avoid drinking large amounts of liquids with your meals.  Avoid bending over or lying down until 2-3 hours after eating. WHAT FOODS ARE NOT RECOMMENDED? The following are some foods and drinks that may worsen your symptoms: Vegetables Tomatoes. Tomato juice. Tomato and spaghetti sauce. Chili peppers. Onion and garlic. Horseradish. Fruits Oranges, grapefruit, and lemon (fruit and juice). Meats High-fat meats, fish, and poultry. This includes hot dogs, ribs, ham, sausage, salami, and bacon. Dairy Whole milk and chocolate milk. Sour cream. Cream. Butter. Ice cream. Cream cheese.  Beverages Coffee and tea, with or without caffeine. Carbonated beverages or energy drinks. Condiments Hot sauce. Barbecue sauce.  Sweets/Desserts Chocolate and cocoa. Donuts. Peppermint and spearmint. Fats and Oils High-fat foods, including [place] fries and potato chips. Other Vinegar. Strong spices, such as black pepper, white  pepper, red pepper, cayenne, curry powder, cloves, ginger, and chili powder. The items listed above may not be a complete list of foods and beverages to avoid. Contact your dietitian for more information. Document Released: 03/26/2005 Document Revised: 03/31/2013 Document Reviewed: 01/28/2013 [facility] Patient Information 2015 [place], [place]. This information is not intended to replace advice given to you by your health care provider. Make sure you discuss any  questions you have with your health care provider.

## 2014-11-17 NOTE — Progress Notes (Signed)
[facility] [address] [place] [address] [phone]  Date:  11/17/2014   Name:  Crystal Howell   DOB:  1990/09/16   MRN:  [id number]  PCP:  Johnnette Barrios, RN    History of Present Illness:  Crystal Howell is a 24 y.o. female patient who presents to [facility] for chief complaint of cough and loss of appetite for the last 7 days. Patient states that she is coughing productive green and yellow sputum. She denies any postnasal drip. She has some nasal congestion and when she forcibly blows, it is a clear mucus.  She has no shortness of breath or dyspnea.  She has no fever, chills, or fatigue.  She does have a mild sore throat. Ear pain. She has noticed a loss of appetite but no abdominal pain, nausea, vomiting, regurge, or constipation.  She has a hx of reflux, though has never taken any medication.  She has taken apple cider vinegar for this which she states has helped in the past.  Currently for the cough, she is taking mucinex, delsym, robitussin to no avail.  Cough worsens at night, and she states that it is not improving.   No sour taste.  She is not currently sexually active.    Patient Active Problem List   Diagnosis Date Noted  . Allergic rhinitis 12/12/2011  . Overweight(278.02) 10/31/2011    Past Medical History  Diagnosis Date  . Hx of menorrhagia   . Allergy   . Anemia   . Chiari malformation   . Headache(784.0)   . Fibroids     Past Surgical History  Procedure Laterality Date  . Wisdom tooth extraction      Social History  Substance Use Topics  . Smoking status: Never Smoker   . Smokeless tobacco: Never Used  . Alcohol Use: No    Family History  Problem Relation Age of Onset  . Thyroid disease Mother   . Anemia Mother   . Heart disease Maternal Grandmother   . Emphysema Maternal Grandmother   . Stroke Maternal Grandmother   . Mental illness Maternal Grandmother   . Lung cancer Maternal Grandfather   . Liver disease  Father   . Cancer Paternal Aunt     Allergies  Allergen Reactions  . Doxycycline   . Erythromycin   . Nabumetone     Stomach upset    Medication list has been reviewed and updated.  Current Outpatient Prescriptions on File Prior to Visit  Medication Sig Dispense Refill  . albuterol (PROVENTIL HFA;VENTOLIN HFA) 108 (90 BASE) MCG/ACT inhaler Inhale 2 puffs into the lungs every 4 (four) hours as needed for wheezing or shortness of breath (cough, shortness of breath or wheezing.). 1 Inhaler 1  . gabapentin (NEURONTIN) 300 MG capsule TAKE ONE CAPSULE BY MOUTH 3 TIMES A DAY AS NEEDED (FOR HEADACHES) 90 capsule 5  . levonorgestrel (MIRENA) 20 MCG/24HR IUD 1 each by Intrauterine route once.    . loratadine (CLARITIN) 10 MG tablet Take 10 mg by mouth daily.    . meclizine (ANTIVERT) 25 MG tablet Take 1 tablet (25 mg total) by mouth as needed for dizziness. Take one pill daily as needed for dizziness. (Patient not taking: Reported on 11/17/2014) 30 tablet 1   No current facility-administered medications on file prior to visit.    ROS ROS otherwise unremarkable unless listed above.   Physical Examination: BP 110/60 mmHg  Pulse 94  Temp(Src) 98.4 F (36.9  C) (Oral)  Resp 16  Ht 5\' 5"  (1.651 m)  Wt 201 lb (91.173 kg)  BMI 33.45 kg/m2  SpO2 98%  LMP 10/28/2014 Ideal Body Weight: Weight in (lb) to have BMI = 25: 149.9 Wt Readings from Last 3 Encounters:  11/17/14 201 lb (91.173 kg)  08/18/14 205 lb 6 oz (93.157 kg)  05/15/14 200 lb (90.719 kg)    Physical Exam Alert, oriented, protective in no acute distress. PERRLA with normal conjunctiva. 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 There is very little rhinorrhea.  Tonsils are not erythematous, edematous, or exudates.  Breath sounds are normal and circulating air without wheezing or rhonchi.  Deep respirations may incite cough.    Assessment and Plan: 24 year old female is here today for chief complaint of cough and loss of appetite.   Upper respiratory infection likely.  Treating with abx at this time.  I have advised her that she contact if her appetite does not return following treatment, though this can present with symptoms.  Given GERD food restrictions and lifestyle choices today.  Will rtc if sxs do not resolve.    Productive cough - Plan: Guaifenesin (MUCINEX MAXIMUM STRENGTH) 1200 MG TB12, azithromycin (ZITHROMAX) 250 MG tablet, chlorpheniramine-HYDROcodone (TUSSIONEX PENNKINETIC ER) 10-8 MG/5ML SUER   Ivar Drape, PA-C [facility] 11/17/2014 6:06 PM

## 2015-02-09 ENCOUNTER — Ambulatory Visit (INDEPENDENT_AMBULATORY_CARE_PROVIDER_SITE_OTHER): Payer: 59 | Admitting: Family Medicine

## 2015-02-09 VITALS — BP 100/60 | HR 81 | Temp 98.0°F | Resp 16 | Ht 64.5 in | Wt 197.0 lb

## 2015-02-09 DIAGNOSIS — R079 Chest pain, unspecified: Secondary | ICD-10-CM | POA: Diagnosis not present

## 2015-02-09 DIAGNOSIS — R0602 Shortness of breath: Secondary | ICD-10-CM | POA: Diagnosis not present

## 2015-02-09 DIAGNOSIS — R39198 Other difficulties with micturition: Secondary | ICD-10-CM | POA: Diagnosis not present

## 2015-02-09 LAB — POCT CBC
GRANULOCYTE PERCENT: 58.9 % (ref 37–80)
HEMATOCRIT: 39.4 % (ref 37.7–47.9)
HEMOGLOBIN: 13.5 g/dL (ref 12.2–16.2)
Lymph, poc: 1.9 (ref 0.6–3.4)
MCH, POC: 31.6 pg — AB (ref 27–31.2)
MCHC: 34.3 g/dL (ref 31.8–35.4)
MCV: 92 fL (ref 80–97)
MID (cbc): 0.2 (ref 0–0.9)
MPV: 6.9 fL (ref 0–99.8)
PLATELET COUNT, POC: 244 10*3/uL (ref 142–424)
POC GRANULOCYTE: 3.1 (ref 2–6.9)
POC LYMPH PERCENT: 37 %L (ref 10–50)
POC MID %: 4.1 %M (ref 0–12)
RBC: 4.28 M/uL (ref 4.04–5.48)
RDW, POC: 14 %
WBC: 5.2 10*3/uL (ref 4.6–10.2)

## 2015-02-09 LAB — COMPREHENSIVE METABOLIC PANEL
ALBUMIN: 3.9 g/dL (ref 3.6–5.1)
ALT: 9 U/L (ref 6–29)
AST: 14 U/L (ref 10–30)
Alkaline Phosphatase: 39 U/L (ref 33–115)
BILIRUBIN TOTAL: 0.9 mg/dL (ref 0.2–1.2)
BUN: 9 mg/dL (ref 7–25)
CO2: 23 mmol/L (ref 20–31)
CREATININE: 0.64 mg/dL (ref 0.50–1.10)
Calcium: 8.8 mg/dL (ref 8.6–10.2)
Chloride: 104 mmol/L (ref 98–110)
Glucose, Bld: 79 mg/dL (ref 65–99)
Potassium: 3.9 mmol/L (ref 3.5–5.3)
SODIUM: 135 mmol/L (ref 135–146)
Total Protein: 6.5 g/dL (ref 6.1–8.1)

## 2015-02-09 LAB — FERRITIN: Ferritin: 18 ng/mL (ref 10–291)

## 2015-02-09 LAB — POCT URINALYSIS DIP (MANUAL ENTRY)
BILIRUBIN UA: NEGATIVE
BILIRUBIN UA: NEGATIVE
Glucose, UA: NEGATIVE
Nitrite, UA: NEGATIVE
PH UA: 5.5
Protein Ur, POC: NEGATIVE
SPEC GRAV UA: 1.02
Urobilinogen, UA: 0.2

## 2015-02-09 LAB — POC MICROSCOPIC URINALYSIS (UMFC): Mucus: ABSENT

## 2015-02-09 LAB — TSH: TSH: 1.484 u[IU]/mL (ref 0.350–4.500)

## 2015-02-09 MED ORDER — DIPHENHYDRAMINE HCL (SLEEP) 25 MG PO TABS: 25.0000 mg | ORAL_TABLET | Freq: Every evening | ORAL | Status: DC | PRN

## 2015-02-09 MED ORDER — ALBUTEROL SULFATE (2.5 MG/3ML) 0.083% IN NEBU
2.5000 mg | INHALATION_SOLUTION | Freq: Once | RESPIRATORY_TRACT | Status: AC
  Administered 2015-02-09: 2.5 mg via RESPIRATORY_TRACT

## 2015-02-09 MED ORDER — CIPROFLOXACIN HCL 250 MG PO TABS: 250.0000 mg | ORAL_TABLET | Freq: Two times a day (BID) | ORAL | Status: DC

## 2015-02-09 MED ORDER — ALBUTEROL SULFATE 108 (90 BASE) MCG/ACT IN AEPB: 2.0000 | INHALATION_SPRAY | RESPIRATORY_TRACT | Status: DC | PRN

## 2015-02-09 MED ORDER — SULFAMETHOXAZOLE-TRIMETHOPRIM 800-160 MG PO TABS: 1.0000 | ORAL_TABLET | Freq: Two times a day (BID) | ORAL | Status: DC

## 2015-02-09 MED ORDER — MOMETASONE FURO-FORMOTEROL FUM 100-5 MCG/ACT IN AERO: 2.0000 | INHALATION_SPRAY | Freq: Two times a day (BID) | RESPIRATORY_TRACT | Status: DC

## 2015-02-09 NOTE — Progress Notes (Signed)
Subjective:    Patient ID: Crystal Howell, female    DOB: 1991/01/12, 24 y.o.   MRN: [id number] Chief Complaint  Patient presents with  . Chest Pain    x 4 days  . Insomnia    x 1 week  . Urine odor    x 1 week    HPI For the past week she has been waking up at 3 a.m and 5 a.m. She has no idea what is waking her up - no need to urinate, pain, apnea, cough.  It takes her about 30 minutes to go back to sleep and so she is feeling really fatigued during the day and in the morning.  She is going to bed around 10:30 to 11:30 and has to get up at 6:30 for work. She is not drinking any caffeine during the day and is not taking any supplements. No alcohol. Not drinking any liquids before bed.  Not napping during the day.  C/o left-sided chest tightness. She first noticed yesterday  While she was walking outside.  Has had rare int sxs over the prior week but then increased on Saturday, More when she is walking and yesterday it started more severe for over an hour radiating to her right chest and causing some numbness. Prior episodes were not as severe and just lasted for a few minutes.  No cough, does sometimes cause SHoB and wheezing, no diaphoresis, doesn't feel like gas so she is concerned it is from her heart. No other radiation.  No change with position change or pain with inspiration.  Does occ feel heart skip a beat or stop prior to chest getting tight. Occ lower est edema but improved from prior.  Occ lightheaded/dizzy when sitting still. No orthostatic sxs.  A little sinus congestion.  No sig med use. Using nasocort and claritin occ.  Occ heartburn with fried foods - had some Sat but none since. No meds.  She did have an episodes of decreased po and diarrhea sev wks ago but resolving. No exercise, generally healthy diet.  Feel really tired a lot and mood cranky since the sleep changes.   Has not needed the Neurontin for HAs or alb though feels like she may need this. Periods getting  lighter  PCP at Dr. Kenton Kingfisher at [place].    Past Medical History  Diagnosis Date  . Hx of menorrhagia   . Allergy   . Anemia   . Chiari malformation   . Headache(784.0)   . Fibroids    Current Outpatient Prescriptions on File Prior to Visit  Medication Sig Dispense Refill  . albuterol (PROVENTIL HFA;VENTOLIN HFA) 108 (90 BASE) MCG/ACT inhaler Inhale 2 puffs into the lungs every 4 (four) hours as needed for wheezing or shortness of breath (cough, shortness of breath or wheezing.). 1 Inhaler 1  . gabapentin (NEURONTIN) 300 MG capsule TAKE ONE CAPSULE BY MOUTH 3 TIMES A DAY AS NEEDED (FOR HEADACHES) 90 capsule 5  . levonorgestrel (MIRENA) 20 MCG/24HR IUD 1 each by Intrauterine route once.    . loratadine (CLARITIN) 10 MG tablet Take 10 mg by mouth daily.     No current facility-administered medications on file prior to visit.   Allergies  Allergen Reactions  . Doxycycline   . Erythromycin   . Nabumetone     Stomach upset      Review of Systems  Constitutional: Positive for appetite change and fatigue. Negative for fever, chills, diaphoresis, activity change and unexpected weight change.  Eyes: Negative for visual disturbance.  Respiratory: Positive for chest tightness and shortness of breath. Negative for apnea, cough and wheezing.   Cardiovascular: Positive for chest pain and palpitations. Negative for leg swelling.  Gastrointestinal: Positive for diarrhea. Negative for vomiting, abdominal pain and constipation.  Genitourinary: Positive for vaginal bleeding and menstrual problem. Negative for dysuria, hematuria, decreased urine volume and difficulty urinating.  Musculoskeletal: Positive for myalgias and back pain. Negative for joint swelling, arthralgias and gait problem.  Allergic/Immunologic: Negative for immunocompromised state.  Neurological: Negative for dizziness, syncope, weakness, light-headedness, numbness and headaches.  Hematological: Does not bruise/bleed easily.   Psychiatric/Behavioral: Positive for sleep disturbance. Negative for dysphoric mood.       Objective:  BP 100/60 mmHg  Pulse 81  Temp(Src) 98 F (36.7 C) (Oral)  Resp 16  Ht 5' 4.5" (1.638 m)  Wt 197 lb (89.359 kg)  BMI 33.31 kg/m2  SpO2 98%  LMP 01/24/2015  Physical Exam  Constitutional: She is oriented to person, place, and time. She appears well-developed and well-nourished. No distress.  HENT:  Head: Normocephalic and atraumatic.  Right Ear: External ear and ear canal normal. Tympanic membrane is injected and retracted.  Left Ear: Tympanic membrane, external ear and ear canal normal.  Nose: Nose normal. No mucosal edema or rhinorrhea.  Mouth/Throat: Uvula is midline, oropharynx is clear and moist and mucous membranes are normal. No oropharyngeal exudate.  Eyes: Conjunctivae are normal. Right eye exhibits no discharge. Left eye exhibits no discharge. No scleral icterus.  Neck: Normal range of motion. Neck supple. Thyromegaly present.  Cardiovascular: Normal rate, regular rhythm, normal heart sounds and intact distal pulses.   Pulmonary/Chest: Effort normal and breath sounds normal.  Abdominal: Soft. Bowel sounds are normal. She exhibits no distension and no mass. There is no tenderness. There is no rebound and no guarding.  Lymphadenopathy:    She has no cervical adenopathy.  Neurological: She is alert and oriented to person, place, and time.  Skin: Skin is warm and dry. She is not diaphoretic. No erythema.  Psychiatric: She has a normal mood and affect. Her behavior is normal.   Results for orders placed or performed in visit on 02/09/15  POCT CBC  Result Value Ref Range   WBC 5.2 4.6 - 10.2 K/uL   Lymph, poc 1.9 0.6 - 3.4   POC LYMPH PERCENT 37.0 10 - 50 %L   MID (cbc) 0.2 0 - 0.9   POC MID % 4.1 0 - 12 %M   POC Granulocyte 3.1 2 - 6.9   Granulocyte percent 58.9 37 - 80 %G   RBC 4.28 4.04 - 5.48 M/uL   Hemoglobin 13.5 12.2 - 16.2 g/dL   HCT, POC 39.4 37.7 - 47.9 %    MCV 92.0 80 - 97 fL   MCH, POC 31.6 (A) 27 - 31.2 pg   MCHC 34.3 31.8 - 35.4 g/dL   RDW, POC 14.0 %   Platelet Count, POC 244 142 - 424 K/uL   MPV 6.9 0 - 99.8 fL  POCT urinalysis dipstick  Result Value Ref Range   Color, UA yellow yellow   Clarity, UA clear clear   Glucose, UA negative negative   Bilirubin, UA negative negative   Ketones, POC UA negative negative   Spec Grav, UA 1.020    Blood, UA trace-lysed (A) negative   pH, UA 5.5    Protein Ur, POC negative negative   Urobilinogen, UA 0.2    Nitrite, UA Negative Negative  Leukocytes, UA large (3+) (A) Negative  POCT Microscopic Urinalysis ([facility])  Result Value Ref Range   WBC,UR,HPF,POC Moderate (A) None WBC/hpf   RBC,UR,HPF,POC None None RBC/hpf   Bacteria Moderate (A) None, Too numerous to count   Mucus Absent Absent   Epithelial Cells, UR Per Microscopy Few (A) None, Too numerous to count cells/hpf     [facility] reading (PRIMARY) by  Dr. Brigitte Pulse. Orthostatics neg Predicted peak flow 483; actual peak flow - pre: 350 and post: 400 EKG: NSR, flipped Ts in lead III, V1 and V2    Assessment & Plan:   1. Chest pain, unspecified chest pain type - suspect may be due to bronchospasm  2. Shortness of breath - start trial of ICS with qhs nasal steroid. If sxs recur during day - try alb to see if relieves.  3. Abnormal urination - incidentally found UTI  4.   Insomnia - treat prn benadryl.  Could try trazodone or lorazepam if sleep continues.  Orders Placed This Encounter  Procedures  . Urine culture  . TSH  . Comprehensive metabolic panel  . Ferritin  . POCT CBC  . POCT urinalysis dipstick  . POCT Microscopic Urinalysis ([facility])  . EKG 12-Lead    Meds ordered this encounter  Medications  . albuterol (PROVENTIL) (2.5 MG/3ML) 0.083% nebulizer solution 2.5 mg    Sig:   . DISCONTD: mometasone-formoterol (DULERA) 100-5 MCG/ACT AERO    Sig: Inhale 2 puffs into the lungs 2 (two) times daily.    Dispense:  13 g    Refill:   0  . Albuterol Sulfate (PROAIR RESPICLICK) 220 (90 BASE) MCG/ACT AEPB    Sig: Inhale 2 puffs into the lungs every 4 (four) hours as needed.    Dispense:  1 each    Refill:  0  . diphenhydrAMINE (SOMINEX) 25 MG tablet    Sig: Take 1-2 tablets (25-50 mg total) by mouth at bedtime as needed for allergies or sleep.    Dispense:  30 tablet    Refill:  0  . DISCONTD: ciprofloxacin (CIPRO) 250 MG tablet    Sig: Take 1 tablet (250 mg total) by mouth 2 (two) times daily.    Dispense:  6 tablet    Refill:  0  . sulfamethoxazole-trimethoprim (BACTRIM DS,SEPTRA DS) 800-160 MG tablet    Sig: Take 1 tablet by mouth 2 (two) times daily.    Dispense:  6 tablet    Refill:  0    Please do NOT dispense cipro - pt report diarrhea with cipro prior   Over 40 min spent in face-to-face evaluation of and consultation with patient and coordination of care.  Over 50% of this time was spent counseling this patient.   Delman Cheadle, MD MPH

## 2015-02-09 NOTE — Patient Instructions (Addendum)
Start 1-2 tabs of benadryl (diphenhydramine) before bed for a week, then can use as needed. Start the dulera twice a day and use the nasocort every night before bed.  If the chest tightness records, try the albuterol. It appears you do have a bladder infection.  Insomnia Insomnia is a sleep disorder that makes it difficult to fall asleep or to stay asleep. Insomnia can cause tiredness (fatigue), low energy, difficulty concentrating, mood swings, and poor performance at work or school.  There are three different ways to classify insomnia:  Difficulty falling asleep.  Difficulty staying asleep.  Waking up too early in the morning. Any type of insomnia can be long-term (chronic) or short-term (acute). Both are common. Short-term insomnia usually lasts for three months or less. Chronic insomnia occurs at least three times a week for longer than three months. CAUSES  Insomnia may be caused by another condition, situation, or substance, such as:  Anxiety.  Certain medicines.  Gastroesophageal reflux disease (GERD) or other gastrointestinal conditions.  Asthma or other breathing conditions.  Restless legs syndrome, sleep apnea, or other sleep disorders.  Chronic pain.  Menopause. This may include hot flashes.  Stroke.  Abuse of alcohol, tobacco, or illegal drugs.  Depression.  Caffeine.   Neurological disorders, such as Alzheimer disease.  An overactive thyroid (hyperthyroidism). The cause of insomnia may not be known. RISK FACTORS Risk factors for insomnia include:  Gender. Women are more commonly affected than men.  Age. Insomnia is more common as you get older.  Stress. This may involve your professional or personal life.  Income. Insomnia is more common in people with lower income.  Lack of exercise.   Irregular work schedule or night shifts.  Traveling between different time zones. SIGNS AND SYMPTOMS If you have insomnia, trouble falling asleep or trouble  staying asleep is the main symptom. This may lead to other symptoms, such as:  Feeling fatigued.  Feeling nervous about going to sleep.  Not feeling rested in the morning.  Having trouble concentrating.  Feeling irritable, anxious, or depressed. TREATMENT  Treatment for insomnia depends on the cause. If your insomnia is caused by an underlying condition, treatment will focus on addressing the condition. Treatment may also include:   Medicines to help you sleep.  Counseling or therapy.  Lifestyle adjustments. HOME CARE INSTRUCTIONS   Take medicines only as directed by your health care provider.  Keep regular sleeping and waking hours. Avoid naps.  Keep a sleep diary to help you and your health care provider figure out what could be causing your insomnia. Include:   When you sleep.  When you wake up during the night.  How well you sleep.   How rested you feel the next day.  Any side effects of medicines you are taking.  What you eat and drink.   Make your bedroom a comfortable place where it is easy to fall asleep:  Put up shades or special blackout curtains to block light from outside.  Use a white noise machine to block noise.  Keep the temperature cool.   Exercise regularly as directed by your health care provider. Avoid exercising right before bedtime.  Use relaxation techniques to manage stress. Ask your health care provider to suggest some techniques that may work well for you. These may include:  Breathing exercises.  Routines to release muscle tension.  Visualizing peaceful scenes.  Cut back on alcohol, caffeinated beverages, and cigarettes, especially close to bedtime. These can disrupt your sleep.  Do  not overeat or eat spicy foods right before bedtime. This can lead to digestive discomfort that can make it hard for you to sleep.  Limit screen use before bedtime. This includes:  Watching TV.  Using your smartphone, tablet, and  computer.  Stick to a routine. This can help you fall asleep faster. Try to do a quiet activity, brush your teeth, and go to bed at the same time each night.  Get out of bed if you are still awake after 15 minutes of trying to sleep. Keep the lights down, but try reading or doing a quiet activity. When you feel sleepy, go back to bed.  Make sure that you drive carefully. Avoid driving if you feel very sleepy.  Keep all follow-up appointments as directed by your health care provider. This is important. SEEK MEDICAL CARE IF:   You are tired throughout the day or have trouble in your daily routine due to sleepiness.  You continue to have sleep problems or your sleep problems get worse. SEEK IMMEDIATE MEDICAL CARE IF:   You have serious thoughts about hurting yourself or someone else.   This information is not intended to replace advice given to you by your health care provider. Make sure you discuss any questions you have with your health care provider.   Document Released: 03/23/2000 Document Revised: 12/15/2014 Document Reviewed: 12/25/2013 Elsevier Interactive Patient Education [employer].

## 2015-02-10 LAB — URINE CULTURE

## 2015-02-12 ENCOUNTER — Encounter: Payer: Self-pay | Admitting: Family Medicine

## 2015-02-19 ENCOUNTER — Telehealth: Payer: Self-pay

## 2015-02-19 NOTE — Telephone Encounter (Signed)
Needs a refill on dulara inhaler

## 2015-02-21 MED ORDER — MOMETASONE FURO-FORMOTEROL FUM 100-5 MCG/ACT IN AERO: 2.0000 | INHALATION_SPRAY | Freq: Two times a day (BID) | RESPIRATORY_TRACT | Status: DC

## 2015-02-21 NOTE — Telephone Encounter (Signed)
Can we refill. It looks like pt received Dulera on 11/2 so should need a refill on 12/2. Please advise.

## 2015-02-21 NOTE — Telephone Encounter (Signed)
So, spoke with patient.  She has not taken the dulera yet.  She was told by pharmacy that she needs a prior authorization to be done to obtain it.  They told her that she can not contact the number on the card. Can we go ahead and put this through.  She needs a preventative inhaler, so please follow up with her that it was authorized when it is done.

## 2015-02-21 NOTE — Telephone Encounter (Signed)
Pt called stating her mail order pharmacy is [facility]. Sent Dulera to them.

## 2015-02-21 NOTE — Telephone Encounter (Signed)
Called the pharmacy to get Prior Auth number to call her insurance. They state pt has to use mail order and she does not need PA. Left message for pt to call back.

## 2015-02-21 NOTE — Addendum Note (Signed)
Addended by: Jannette Spanner on: 02/21/2015 12:10 PM   Modules accepted: Orders

## 2015-03-04 MED ORDER — FLUTICASONE-SALMETEROL 100-50 MCG/DOSE IN AEPB: 1.0000 | INHALATION_SPRAY | Freq: Two times a day (BID) | RESPIRATORY_TRACT | Status: DC

## 2015-03-04 NOTE — Telephone Encounter (Signed)
Sent in advair.  We were using an inhaler as pt has been having some left sided chest tightness during activity with occ wheezing or SHoB. This had only been going on for a week prior to her appt so now that it has been > 3 wks since appt hopefully it has resolved in which case pt can call [facility] and have rx held/cancelled. If it is still happening, then start advair and recheck in 1-2 wks if persisting. If worsening, f/u asap.

## 2015-03-04 NOTE — Telephone Encounter (Signed)
See Tamara's note below about Dulera not needing PA, just to be ordered through mail order, which was done. Got fax from [facility] with PA form that needs to be completed for [facility]. Form lists following preferred meds that pt must have tried/failed before Dulera will be approved: Advair Diskus or HFA, Breo, Symbicort. Dr Brigitte Pulse, do you want to Rx one of these instead?

## 2015-03-04 NOTE — Addendum Note (Signed)
Addended by: Delman Cheadle on: 03/04/2015 06:13 PM   Modules accepted: Orders

## 2015-03-07 NOTE — Telephone Encounter (Signed)
LMOM w/Dr Raul Del message and asked for CB if any questions.

## 2015-03-11 ENCOUNTER — Other Ambulatory Visit: Payer: Self-pay | Admitting: Family Medicine

## 2015-03-14 NOTE — Telephone Encounter (Signed)
Refill once, please have pt RTC while still on the advair.

## 2015-03-14 NOTE — Telephone Encounter (Signed)
Dr Brigitte Pulse, do you want to give RF or RTC?

## 2015-08-09 ENCOUNTER — Emergency Department (HOSPITAL_BASED_OUTPATIENT_CLINIC_OR_DEPARTMENT_OTHER)
Admission: EM | Admit: 2015-08-09 | Discharge: 2015-08-09 | Disposition: A | Discharge status: Home / Self Care | Payer: 59 | Attending: Emergency Medicine | Admitting: Emergency Medicine

## 2015-08-09 ENCOUNTER — Encounter (HOSPITAL_BASED_OUTPATIENT_CLINIC_OR_DEPARTMENT_OTHER): Payer: Self-pay

## 2015-08-09 DIAGNOSIS — R109 Unspecified abdominal pain: Secondary | ICD-10-CM | POA: Diagnosis present

## 2015-08-09 DIAGNOSIS — R112 Nausea with vomiting, unspecified: Secondary | ICD-10-CM

## 2015-08-09 DIAGNOSIS — R197 Diarrhea, unspecified: Secondary | ICD-10-CM | POA: Insufficient documentation

## 2015-08-09 LAB — URINALYSIS, ROUTINE W REFLEX MICROSCOPIC
BILIRUBIN URINE: NEGATIVE
GLUCOSE, UA: NEGATIVE mg/dL
HGB URINE DIPSTICK: NEGATIVE
KETONES UR: NEGATIVE mg/dL
NITRITE: NEGATIVE
PH: 5.5 (ref 5.0–8.0)
Protein, ur: NEGATIVE mg/dL
SPECIFIC GRAVITY, URINE: 1.03 (ref 1.005–1.030)

## 2015-08-09 LAB — BASIC METABOLIC PANEL
Anion gap: 8 (ref 5–15)
BUN: 11 mg/dL (ref 6–20)
CHLORIDE: 106 mmol/L (ref 101–111)
CO2: 22 mmol/L (ref 22–32)
Calcium: 9 mg/dL (ref 8.9–10.3)
Creatinine, Ser: 0.72 mg/dL (ref 0.44–1.00)
GFR calc non Af Amer: >60 mL/min (ref >60)
Glucose, Bld: 121 mg/dL — ABNORMAL HIGH (ref 65–99)
POTASSIUM: 3.9 mmol/L (ref 3.5–5.1)
SODIUM: 136 mmol/L (ref 135–145)

## 2015-08-09 LAB — URINE MICROSCOPIC-ADD ON

## 2015-08-09 LAB — PREGNANCY, URINE: Preg Test, Ur: NEGATIVE

## 2015-08-09 MED ORDER — FOSFOMYCIN TROMETHAMINE 3 G PO PACK
3.0000 g | PACK | Freq: Once | ORAL | Status: AC
  Administered 2015-08-09: 3 g via ORAL
  Filled 2015-08-09: qty 3

## 2015-08-09 MED ORDER — KETOROLAC TROMETHAMINE 30 MG/ML IJ SOLN
60.0000 mg | Freq: Once | INTRAMUSCULAR | Status: AC
  Administered 2015-08-09: 60 mg via INTRAMUSCULAR
  Filled 2015-08-09: qty 2

## 2015-08-09 MED ORDER — SODIUM CHLORIDE 0.9 % IV BOLUS (SEPSIS): 500.0000 mL | Freq: Once | INTRAVENOUS | Status: DC

## 2015-08-09 MED ORDER — ONDANSETRON 8 MG PO TBDP: ORAL_TABLET | ORAL | Status: DC

## 2015-08-09 MED ORDER — ONDANSETRON HCL 4 MG/2ML IJ SOLN: 4.0000 mg | Freq: Once | INTRAMUSCULAR | Status: DC

## 2015-08-09 MED ORDER — ONDANSETRON 8 MG PO TBDP
8.0000 mg | ORAL_TABLET | Freq: Once | ORAL | Status: AC
  Administered 2015-08-09: 8 mg via ORAL
  Filled 2015-08-09: qty 1

## 2015-08-09 MED ORDER — DICYCLOMINE HCL 20 MG PO TABS: 20.0000 mg | ORAL_TABLET | Freq: Two times a day (BID) | ORAL | Status: DC

## 2015-08-09 MED ORDER — DICYCLOMINE HCL 10 MG/ML IM SOLN
20.0000 mg | Freq: Once | INTRAMUSCULAR | Status: AC
  Administered 2015-08-09: 20 mg via INTRAMUSCULAR
  Filled 2015-08-09: qty 2

## 2015-08-09 MED ORDER — KETOROLAC TROMETHAMINE 30 MG/ML IJ SOLN: 30.0000 mg | Freq: Once | INTRAMUSCULAR | Status: DC

## 2015-08-09 NOTE — ED Provider Notes (Signed)
CSN: [id number]     Arrival date & time 08/09/15  0310 History   First MD Initiated Contact with Patient 08/09/15 [id number]     Chief Complaint  Patient presents with  . Abdominal Pain     (Consider location/radiation/quality/duration/timing/severity/associated sxs/prior Treatment) Patient is a 25 y.o. female presenting with diarrhea. The history is provided by the patient.  Diarrhea Quality:  Watery Severity:  Mild Onset quality:  Sudden Number of episodes:  3 Timing:  Sporadic Progression:  Unchanged Relieved by:  Nothing Worsened by:  Nothing tried Ineffective treatments:  None tried Associated symptoms: abdominal pain and vomiting   Associated symptoms: no fever   Associated symptoms comment:  Dry heaves and then crampy abdominal pain started after both emesis and diarrhea Risk factors: no suspicious food intake     Past Medical History  Diagnosis Date  . Hx of menorrhagia   . Allergy   . Anemia   . Chiari malformation   . Headache(784.0)   . Fibroids    Past Surgical History  Procedure Laterality Date  . Wisdom tooth extraction     Family History  Problem Relation Age of Onset  . Thyroid disease Mother   . Anemia Mother   . Heart disease Maternal Grandmother   . Emphysema Maternal Grandmother   . Stroke Maternal Grandmother   . Mental illness Maternal Grandmother   . Lung cancer Maternal Grandfather   . Liver disease Father   . Cancer Paternal Aunt    Social History  Substance Use Topics  . Smoking status: Never Smoker   . Smokeless tobacco: Never Used  . Alcohol Use: No   OB History    Gravida Para Term Preterm AB TAB SAB Ectopic Multiple Living   0              Review of Systems  Constitutional: Negative for fever.  Gastrointestinal: Positive for vomiting, abdominal pain and diarrhea.  Genitourinary: Negative for dysuria.  All other systems reviewed and are negative.     Allergies  Doxycycline; Erythromycin; and Nabumetone  Home Medications    Prior to Admission medications   Medication Sig Start Date End Date Taking? Authorizing Provider  ADVAIR DISKUS 100-50 MCG/DOSE AEPB Use 1 puff two times daily 03/14/15   Shawnee Knapp, MD  Albuterol Sulfate (PROAIR RESPICLICK) 123XX123 (90 BASE) MCG/ACT AEPB Inhale 2 puffs into the lungs every 4 (four) hours as needed. 02/09/15   Shawnee Knapp, MD  diphenhydrAMINE (SOMINEX) 25 MG tablet Take 1-2 tablets (25-50 mg total) by mouth at bedtime as needed for allergies or sleep. 02/09/15   Shawnee Knapp, MD  gabapentin (NEURONTIN) 300 MG capsule TAKE ONE CAPSULE BY MOUTH 3 TIMES A DAY AS NEEDED (FOR HEADACHES) 01/05/14   Star Age, MD  levonorgestrel (MIRENA) 20 MCG/24HR IUD 1 each by Intrauterine route once.    Historical Provider, MD  loratadine (CLARITIN) 10 MG tablet Take 10 mg by mouth daily.    Historical Provider, MD  mometasone-formoterol (DULERA) 100-5 MCG/ACT AERO Inhale 2 puffs into the lungs 2 (two) times daily. 02/21/15   Joretta Bachelor, PA  sulfamethoxazole-trimethoprim (BACTRIM DS,SEPTRA DS) 800-160 MG tablet Take 1 tablet by mouth 2 (two) times daily. 02/09/15   Shawnee Knapp, MD   BP 123/83 mmHg  Pulse 81  Temp(Src) 98.1 F (36.7 C) (Oral)  Resp 18  Ht 5\' 4"  (1.626 m)  Wt 197 lb (89.359 kg)  BMI 33.80 kg/m2  SpO2 96%  LMP 07/26/2015  Physical Exam  Constitutional: She is oriented to person, place, and time. She appears well-developed and well-nourished. No distress.  HENT:  Head: Normocephalic and atraumatic.  Mouth/Throat: Oropharynx is clear and moist.  Eyes: Conjunctivae are normal. Pupils are equal, round, and reactive to light.  Neck: Normal range of motion. Neck supple.  Cardiovascular: Normal rate, regular rhythm and intact distal pulses.   Pulmonary/Chest: Effort normal and breath sounds normal. No respiratory distress. She has no wheezes. She has no rales.  Abdominal: Soft. She exhibits no distension. Bowel sounds are increased. There is no tenderness. There is no rigidity, no  rebound, no guarding, no tenderness at McBurney's point and negative Murphy's sign.  Musculoskeletal: Normal range of motion.  Neurological: She is alert and oriented to person, place, and time.  Skin: Skin is warm and dry.  Psychiatric: She has a normal mood and affect.    ED Course  Procedures (including critical care time) Labs Review Labs Reviewed  URINALYSIS, ROUTINE W REFLEX MICROSCOPIC (NOT AT [facility]) - Abnormal; Notable for the following:    APPearance CLOUDY (*)    Leukocytes, UA MODERATE (*)    All other components within normal limits  BASIC METABOLIC PANEL - Abnormal; Notable for the following:    Glucose, Bld 121 (*)    All other components within normal limits  URINE MICROSCOPIC-ADD ON - Abnormal; Notable for the following:    Squamous Epithelial / LPF 6-30 (*)    Bacteria, UA MANY (*)    All other components within normal limits  PREGNANCY, URINE    Imaging Review No results found. I have personally reviewed and evaluated these images and lab results as part of my medical decision-making.   EKG Interpretation None      MDM   Final diagnoses:  None   Filed Vitals:   08/09/15 0314  BP: 123/83  Pulse: 81  Temp: 98.1 F (36.7 C)  Resp: 18    . Results for orders placed or performed during the hospital encounter of 08/09/15  Urinalysis, Routine w reflex microscopic (not at [facility])  Result Value Ref Range   Color, Urine YELLOW YELLOW   APPearance CLOUDY (A) CLEAR   Specific Gravity, Urine 1.030 1.005 - 1.030   pH 5.5 5.0 - 8.0   Glucose, UA NEGATIVE NEGATIVE mg/dL   Hgb urine dipstick NEGATIVE NEGATIVE   Bilirubin Urine NEGATIVE NEGATIVE   Ketones, ur NEGATIVE NEGATIVE mg/dL   Protein, ur NEGATIVE NEGATIVE mg/dL   Nitrite NEGATIVE NEGATIVE   Leukocytes, UA MODERATE (A) NEGATIVE  Pregnancy, urine  Result Value Ref Range   Preg Test, Ur NEGATIVE NEGATIVE  Basic metabolic panel  Result Value Ref Range   Sodium 136 135 - 145 mmol/L   Potassium 3.9  3.5 - 5.1 mmol/L   Chloride 106 101 - 111 mmol/L   CO2 22 22 - 32 mmol/L   Glucose, Bld 121 (H) 65 - 99 mg/dL   BUN 11 6 - 20 mg/dL   Creatinine, Ser 0.72 0.44 - 1.00 mg/dL   Calcium 9.0 8.9 - 10.3 mg/dL   GFR calc non Af Amer >60 >60 mL/min   GFR calc Af Amer >60 >60 mL/min   Anion gap 8 5 - 15  Urine microscopic-add on  Result Value Ref Range   Squamous Epithelial / LPF 6-30 (A) NONE SEEN   WBC, UA 6-30 0 - 5 WBC/hpf   RBC / HPF 0-5 0 - 5 RBC/hpf   Bacteria, UA MANY (A) NONE  SEEN   Urine-Other MUCOUS PRESENT    No results found.  Well appearing.  Exam and vitals are benign and reassuring.  Treated in the ED with zofran, toradol and bentyl.  Symptoms clearly viral in nature.  Feels much better post medication.  Po challenged in the ED.  Will d/c with zofran and bentyl.  Strict return precautions given    Italia Wolfert, MD 08/09/15 [phone]

## 2015-08-09 NOTE — ED Notes (Signed)
Pt c/o n/v/d yesterday, abdominal pain that started last night

## 2015-10-04 ENCOUNTER — Telehealth: Payer: Self-pay | Admitting: *Deleted

## 2015-10-04 NOTE — Telephone Encounter (Signed)
Unable to reach patient at time of Pre-Visit Call.  Left message for patient to return call when available.    

## 2015-10-05 ENCOUNTER — Ambulatory Visit: Payer: No Typology Code available for payment source | Admitting: Family Medicine

## 2016-05-30 DIAGNOSIS — R3915 Urgency of urination: Secondary | ICD-10-CM | POA: Diagnosis not present

## 2016-05-30 DIAGNOSIS — R109 Unspecified abdominal pain: Secondary | ICD-10-CM | POA: Diagnosis not present

## 2016-05-30 DIAGNOSIS — Z862 Personal history of diseases of the blood and blood-forming organs and certain disorders involving the immune mechanism: Secondary | ICD-10-CM | POA: Diagnosis not present

## 2016-06-26 DIAGNOSIS — Z862 Personal history of diseases of the blood and blood-forming organs and certain disorders involving the immune mechanism: Secondary | ICD-10-CM | POA: Diagnosis not present

## 2016-06-26 DIAGNOSIS — R109 Unspecified abdominal pain: Secondary | ICD-10-CM | POA: Diagnosis not present

## 2016-11-15 ENCOUNTER — Ambulatory Visit (INDEPENDENT_AMBULATORY_CARE_PROVIDER_SITE_OTHER): Payer: 59

## 2016-11-15 ENCOUNTER — Ambulatory Visit (INDEPENDENT_AMBULATORY_CARE_PROVIDER_SITE_OTHER): Payer: 59 | Admitting: Family Medicine

## 2016-11-15 ENCOUNTER — Other Ambulatory Visit: Payer: Self-pay | Admitting: Family Medicine

## 2016-11-15 ENCOUNTER — Encounter: Payer: Self-pay | Admitting: Family Medicine

## 2016-11-15 VITALS — BP 104/68 | HR 82 | Temp 98.3°F | Resp 18 | Ht 65.35 in | Wt 176.0 lb

## 2016-11-15 DIAGNOSIS — M79671 Pain in right foot: Secondary | ICD-10-CM

## 2016-11-15 DIAGNOSIS — M79672 Pain in left foot: Secondary | ICD-10-CM

## 2016-11-15 DIAGNOSIS — M79662 Pain in left lower leg: Secondary | ICD-10-CM

## 2016-11-15 DIAGNOSIS — M25571 Pain in right ankle and joints of right foot: Secondary | ICD-10-CM

## 2016-11-15 DIAGNOSIS — M898X6 Other specified disorders of bone, lower leg: Secondary | ICD-10-CM

## 2016-11-15 DIAGNOSIS — M25572 Pain in left ankle and joints of left foot: Secondary | ICD-10-CM

## 2016-11-15 NOTE — Patient Instructions (Addendum)
Wear soft splint  Apply ice to ankle and foot about 3 times a day for the next 3 days.  Elevate foot when possible  Take ibuprofen 600 mg (3200 mg) 3 times daily with some food for pain and inflammation. Continue this for a few days until the pain comes down  This should mostly resolve over the next week, though it is probably a good idea to be a little cautious with it for 2 or 3 weeks to avoid reinjury  Return if needed      IF you received an x-ray today, you will receive an invoice from [facility]. Please contact [facility] at [phone] with questions or concerns regarding your invoice.   IF you received labwork today, you will receive an invoice from [facility]. Please contact [facility] at [phone] with questions or concerns regarding your invoice.   Our billing staff will not be able to assist you with questions regarding bills from these companies.  You will be contacted with the lab results as soon as they are available. The fastest way to get your results is to activate your My Chart account. Instructions are located on the last page of this paperwork. If you have not heard from [place] regarding the results in 2 weeks, please contact this office.

## 2016-11-15 NOTE — Progress Notes (Signed)
Patient ID: Crystal Howell, female    DOB: 1991/03/27  Age: 26 y.o. MRN: [id number]  Chief Complaint  Patient presents with  . Ankle Pain    pt states she twisted her left ankle yesterday while coming out of a store.     Subjective:   Patient was walking last night and tripped and fell turning right her ankle. She sustained pain in the medial aspect of the ankle, along the fifth metatarsal, and along the mid tibia. She actually did not fall fully to the ground but it just continues to hurt  Current allergies, medications, problem list, past/family and social histories reviewed.  Objective:  BP 104/68   Pulse 82   Temp 98.3 F (36.8 C) (Oral)   Resp 18   Ht 5' 5.35" (1.66 m)   Wt 176 lb (79.8 kg)   LMP 11/03/2016   SpO2 98%   BMI 28.97 kg/m   Mild tenderness of the mid tibia. Also tender around the ankle joint and along the fifth metatarsal. Will x-ray.  Assessment & Plan:   Assessment: 1. Pain in left shin   2. Left foot pain   3. Acute left ankle pain       Plan: X-rays all appear normal. Radiology reading is pending. We will let the patientsee anything that I have missed.  Wear ankle splint  Orders Placed This Encounter  Procedures  . DG Ankle Complete Right    Standing Status:   Future    Number of Occurrences:   1    Standing Expiration Date:   01/15/2018    Order Specific Question:   Reason for Exam (SYMPTOM  OR DIAGNOSIS REQUIRED)    Answer:   right ankle pain, tenderness along the 5th metatarsal, and tender along the tibia    Order Specific Question:   Is patient pregnant?    Answer:   No    Order Specific Question:   Preferred imaging location?    Answer:   External    Order Specific Question:   Radiology Contrast Protocol - do NOT remove file path    Answer:   \\[email or web address]\Radiant\DXFluoroContrastProtocols.pdf  . DG Foot Complete Right    Standing Status:   Future    Number of Occurrences:   1    Standing Expiration Date:   01/15/2018   Order Specific Question:   Reason for Exam (SYMPTOM  OR DIAGNOSIS REQUIRED)    Answer:   right ankle pain, tenderness along the 5th metatarsal, and tender along the tibia    Order Specific Question:   Is patient pregnant?    Answer:   No    Order Specific Question:   Preferred imaging location?    Answer:   External    Order Specific Question:   Radiology Contrast Protocol - do NOT remove file path    Answer:   \\[email or web address]\Radiant\DXFluoroContrastProtocols.pdf    No orders of the defined types were placed in this encounter.        Patient Instructions   Wear soft splint  Apply ice to ankle and foot about 3 times a day for the next 3 days.  Elevate foot when possible  Take ibuprofen 600 mg (3200 mg) 3 times daily with some food for pain and inflammation. Continue this for a few days until the pain comes down  This should mostly resolve over the next week, though it is probably a good idea to be a little cautious with it for 2 or 3 weeks to  avoid reinjury  Return if needed      IF you received an x-ray today, you will receive an invoice from [facility]. Please contact [facility]iology at [phone] with questions or concerns regarding your invoice.   IF you received labwork today, you will receive an invoice from [place]. Please contact [facility] at [phone] with questions or concerns regarding your invoice.   Our billing staff will not be able to assist you with questions regarding bills from these companies.  You will be contacted with the lab results as soon as they are available. The fastest way to get your results is to activate your My Chart account. Instructions are located on the last page of this paperwork. If you have not heard from [place] regarding the results in 2 weeks, please contact this office.         No Follow-up on file.   HOPPER,DAVID, MD 11/15/2016

## 2016-11-20 ENCOUNTER — Telehealth: Payer: Self-pay | Admitting: Family Medicine

## 2016-11-20 NOTE — Telephone Encounter (Signed)
Pt is calling to ask if anyone has looked at her xray that was done the last time she was here & she just wanted to know the status of that xray, the body part was her left ankle & left foot.   PLEASE ADVISE

## 2016-11-22 ENCOUNTER — Telehealth: Payer: Self-pay

## 2016-11-22 NOTE — Telephone Encounter (Signed)
Called pt with results of x-ray and left a detailed message according to her releases of information form. I instructed the pt if she had further questions or concerns to call back or if she is still having problems with her ankle/foot to make another appointment.

## 2016-11-29 DIAGNOSIS — R197 Diarrhea, unspecified: Secondary | ICD-10-CM | POA: Diagnosis not present

## 2016-11-29 DIAGNOSIS — E0590 Thyrotoxicosis, unspecified without thyrotoxic crisis or storm: Secondary | ICD-10-CM | POA: Diagnosis not present

## 2016-11-29 DIAGNOSIS — R109 Unspecified abdominal pain: Secondary | ICD-10-CM | POA: Diagnosis not present

## 2016-11-30 DIAGNOSIS — R197 Diarrhea, unspecified: Secondary | ICD-10-CM | POA: Diagnosis not present

## 2017-01-01 DIAGNOSIS — Z5181 Encounter for therapeutic drug level monitoring: Secondary | ICD-10-CM | POA: Diagnosis not present

## 2017-01-01 DIAGNOSIS — E0590 Thyrotoxicosis, unspecified without thyrotoxic crisis or storm: Secondary | ICD-10-CM | POA: Diagnosis not present

## 2017-01-24 DIAGNOSIS — I951 Orthostatic hypotension: Secondary | ICD-10-CM | POA: Diagnosis not present

## 2017-01-24 DIAGNOSIS — E0590 Thyrotoxicosis, unspecified without thyrotoxic crisis or storm: Secondary | ICD-10-CM | POA: Diagnosis not present

## 2017-01-24 DIAGNOSIS — E063 Autoimmune thyroiditis: Secondary | ICD-10-CM | POA: Diagnosis not present

## 2017-02-01 DIAGNOSIS — I951 Orthostatic hypotension: Secondary | ICD-10-CM | POA: Diagnosis not present

## 2017-03-05 DIAGNOSIS — E039 Hypothyroidism, unspecified: Secondary | ICD-10-CM | POA: Diagnosis not present

## 2017-03-05 DIAGNOSIS — Z8349 Family history of other endocrine, nutritional and metabolic diseases: Secondary | ICD-10-CM | POA: Diagnosis not present

## 2017-03-05 DIAGNOSIS — E063 Autoimmune thyroiditis: Secondary | ICD-10-CM | POA: Diagnosis not present

## 2017-03-05 DIAGNOSIS — Z5181 Encounter for therapeutic drug level monitoring: Secondary | ICD-10-CM | POA: Diagnosis not present

## 2017-04-22 DIAGNOSIS — E039 Hypothyroidism, unspecified: Secondary | ICD-10-CM | POA: Diagnosis not present

## 2017-08-14 DIAGNOSIS — E039 Hypothyroidism, unspecified: Secondary | ICD-10-CM | POA: Diagnosis not present

## 2017-09-03 DIAGNOSIS — E063 Autoimmune thyroiditis: Secondary | ICD-10-CM | POA: Diagnosis not present

## 2017-09-03 DIAGNOSIS — Z8349 Family history of other endocrine, nutritional and metabolic diseases: Secondary | ICD-10-CM | POA: Diagnosis not present

## 2017-09-03 DIAGNOSIS — Z5181 Encounter for therapeutic drug level monitoring: Secondary | ICD-10-CM | POA: Diagnosis not present

## 2017-10-18 DIAGNOSIS — K625 Hemorrhage of anus and rectum: Secondary | ICD-10-CM | POA: Diagnosis not present

## 2018-03-19 DIAGNOSIS — Z6828 Body mass index (BMI) 28.0-28.9, adult: Secondary | ICD-10-CM | POA: Diagnosis not present

## 2018-03-19 DIAGNOSIS — Z01419 Encounter for gynecological examination (general) (routine) without abnormal findings: Secondary | ICD-10-CM | POA: Diagnosis not present

## 2018-03-19 DIAGNOSIS — Z130 Encounter for screening for diseases of the blood and blood-forming organs and certain disorders involving the immune mechanism: Secondary | ICD-10-CM | POA: Diagnosis not present

## 2018-03-19 DIAGNOSIS — Z124 Encounter for screening for malignant neoplasm of cervix: Secondary | ICD-10-CM | POA: Diagnosis not present

## 2018-04-15 DIAGNOSIS — D270 Benign neoplasm of right ovary: Secondary | ICD-10-CM | POA: Diagnosis not present

## 2018-04-15 DIAGNOSIS — R350 Frequency of micturition: Secondary | ICD-10-CM | POA: Diagnosis not present

## 2018-04-15 DIAGNOSIS — D259 Leiomyoma of uterus, unspecified: Secondary | ICD-10-CM | POA: Diagnosis not present

## 2018-04-15 DIAGNOSIS — D251 Intramural leiomyoma of uterus: Secondary | ICD-10-CM | POA: Diagnosis not present

## 2018-06-13 DIAGNOSIS — J00 Acute nasopharyngitis [common cold]: Secondary | ICD-10-CM | POA: Diagnosis not present

## 2018-06-13 DIAGNOSIS — E063 Autoimmune thyroiditis: Secondary | ICD-10-CM | POA: Diagnosis not present

## 2018-06-13 DIAGNOSIS — Z862 Personal history of diseases of the blood and blood-forming organs and certain disorders involving the immune mechanism: Secondary | ICD-10-CM | POA: Diagnosis not present

## 2018-06-13 DIAGNOSIS — R42 Dizziness and giddiness: Secondary | ICD-10-CM | POA: Diagnosis not present

## 2018-07-16 DIAGNOSIS — D649 Anemia, unspecified: Secondary | ICD-10-CM | POA: Diagnosis not present

## 2018-07-16 DIAGNOSIS — R899 Unspecified abnormal finding in specimens from other organs, systems and tissues: Secondary | ICD-10-CM | POA: Diagnosis not present

## 2019-03-09 ENCOUNTER — Encounter (HOSPITAL_COMMUNITY): Payer: Self-pay | Admitting: Emergency Medicine

## 2019-03-09 ENCOUNTER — Emergency Department (HOSPITAL_COMMUNITY)
Admission: EM | Admit: 2019-03-09 | Discharge: 2019-03-09 | Disposition: A | Discharge status: Home / Self Care | Payer: 59 | Attending: Emergency Medicine | Admitting: Emergency Medicine

## 2019-03-09 ENCOUNTER — Other Ambulatory Visit: Payer: Self-pay

## 2019-03-09 DIAGNOSIS — D649 Anemia, unspecified: Secondary | ICD-10-CM | POA: Insufficient documentation

## 2019-03-09 DIAGNOSIS — F329 Major depressive disorder, single episode, unspecified: Secondary | ICD-10-CM | POA: Insufficient documentation

## 2019-03-09 DIAGNOSIS — R45851 Suicidal ideations: Secondary | ICD-10-CM | POA: Insufficient documentation

## 2019-03-09 DIAGNOSIS — E079 Disorder of thyroid, unspecified: Secondary | ICD-10-CM | POA: Insufficient documentation

## 2019-03-09 HISTORY — DX: Disorder of thyroid, unspecified: E07.9

## 2019-03-09 HISTORY — DX: Depression, unspecified: F32.A

## 2019-03-09 LAB — COMPREHENSIVE METABOLIC PANEL
ALT: 13 U/L (ref 0–44)
AST: 20 U/L (ref 15–41)
Albumin: 4.2 g/dL (ref 3.5–5.0)
Alkaline Phosphatase: 37 U/L — ABNORMAL LOW (ref 38–126)
Anion gap: 8 (ref 5–15)
BUN: 11 mg/dL (ref 6–20)
CO2: 25 mmol/L (ref 22–32)
Calcium: 9.7 mg/dL (ref 8.9–10.3)
Chloride: 108 mmol/L (ref 98–111)
Creatinine, Ser: 0.76 mg/dL (ref 0.44–1.00)
GFR calc Af Amer: >60 mL/min (ref >60)
GFR calc non Af Amer: >60 mL/min (ref >60)
Glucose, Bld: 92 mg/dL (ref 70–99)
Potassium: 3.7 mmol/L (ref 3.5–5.1)
Sodium: 141 mmol/L (ref 135–145)
Total Bilirubin: 0.8 mg/dL (ref 0.3–1.2)
Total Protein: 7.5 g/dL (ref 6.5–8.1)

## 2019-03-09 LAB — CBC
HCT: 43 % (ref 36.0–46.0)
Hemoglobin: 13.9 g/dL (ref 12.0–15.0)
MCH: 31.4 pg (ref 26.0–34.0)
MCHC: 32.3 g/dL (ref 30.0–36.0)
MCV: 97.1 fL (ref 80.0–100.0)
Platelets: 260 10*3/uL (ref 150–400)
RBC: 4.43 MIL/uL (ref 3.87–5.11)
RDW: 13.2 % (ref 11.5–15.5)
WBC: 3.4 10*3/uL — ABNORMAL LOW (ref 4.0–10.5)
nRBC: 0 % (ref 0.0–0.2)

## 2019-03-09 LAB — RAPID URINE DRUG SCREEN, HOSP PERFORMED
Amphetamines: NOT DETECTED
Barbiturates: NOT DETECTED
Benzodiazepines: NOT DETECTED
Cocaine: NOT DETECTED
Opiates: NOT DETECTED
Tetrahydrocannabinol: NOT DETECTED

## 2019-03-09 LAB — SALICYLATE LEVEL: Salicylate Lvl: <7 mg/dL (ref 2.8–30.0)

## 2019-03-09 LAB — ETHANOL: Alcohol, Ethyl (B): <10 mg/dL (ref <10)

## 2019-03-09 LAB — I-STAT BETA HCG BLOOD, ED (MC, WL, AP ONLY): I-stat hCG, quantitative: <5 m[IU]/mL (ref <5)

## 2019-03-09 LAB — ACETAMINOPHEN LEVEL: Acetaminophen (Tylenol), Serum: <10 ug/mL — ABNORMAL LOW (ref 10–30)

## 2019-03-09 NOTE — ED Provider Notes (Addendum)
[facility] EMERGENCY DEPARTMENT Provider Note   CSN: [id number] Arrival date & time: 03/09/19  [id number]     History   Chief Complaint Chief Complaint  Patient presents with  . Suicidal    HPI Crystal Howell is a 28 y.o. female with a past medical history of depression presenting to the ED with suicidal ideation.  For the past several weeks has been having worsening depression due to the loss of her grandmother earlier in the year as well as stress related to her job and the pandemic.  She has no specific plan but states that "when I was driving to work this morning I wanted to wreck my car."  Reports prior suicide attempts as a teenager but unable to tell me exactly what circumstances were.  She denies any medication overdose, HI, AVH, alcohol, tobacco or other drug use.     HPI  Past Medical History:  Diagnosis Date  . Allergy   . Anemia   . Chiari malformation   . Depression   . Fibroids   . Headache(784.0)   . Hx of menorrhagia   . Thyroid disease     Patient Active Problem List   Diagnosis Date Noted  . Allergic rhinitis 12/12/2011  . Overweight(278.02) 10/31/2011    Past Surgical History:  Procedure Laterality Date  . WISDOM TOOTH EXTRACTION       OB History    Gravida  0   Para      Term      Preterm      AB      Living        SAB      TAB      Ectopic      Multiple      Live Births               Home Medications    Prior to Admission medications   Not on File    Family History Family History  Problem Relation Age of Onset  . Thyroid disease Mother   . Anemia Mother   . Heart disease Maternal Grandmother   . Emphysema Maternal Grandmother   . Stroke Maternal Grandmother   . Mental illness Maternal Grandmother   . Lung cancer Maternal Grandfather   . Liver disease Father   . Cancer Paternal Aunt     Social History Social History   Tobacco Use  . Smoking status: Never Smoker  . Smokeless  tobacco: Never Used  Substance Use Topics  . Alcohol use: No  . Drug use: No     Allergies   Doxycycline, Erythromycin, and Nabumetone   Review of Systems Review of Systems  Constitutional: Negative for appetite change, chills and fever.  HENT: Negative for ear pain, rhinorrhea, sneezing and sore throat.   Eyes: Negative for photophobia and visual disturbance.  Respiratory: Negative for cough, chest tightness, shortness of breath and wheezing.   Cardiovascular: Negative for chest pain and palpitations.  Gastrointestinal: Negative for abdominal pain, blood in stool, constipation, diarrhea, nausea and vomiting.  Genitourinary: Negative for dysuria, hematuria and urgency.  Musculoskeletal: Negative for myalgias.  Skin: Negative for rash.  Neurological: Negative for dizziness, weakness and light-headedness.  Psychiatric/Behavioral: Positive for suicidal ideas. Negative for hallucinations and self-injury. The patient is nervous/anxious.      Physical Exam Updated Vital Signs BP 115/74 (BP Location: Left Arm)   Pulse (!) 107   Temp 98.3 F (36.8 C) (Oral)   Resp 16  Ht 5\' 4"  (1.626 m)   Wt 74.4 kg   LMP 03/01/2019   SpO2 99%   BMI 28.15 kg/m   Physical Exam Vitals signs and nursing note reviewed.  Constitutional:      General: She is not in acute distress.    Appearance: She is well-developed.  HENT:     Head: Normocephalic and atraumatic.     Nose: Nose normal.  Eyes:     General: No scleral icterus.       Left eye: No discharge.     Conjunctiva/sclera: Conjunctivae normal.  Neck:     Musculoskeletal: Normal range of motion and neck supple.  Cardiovascular:     Rate and Rhythm: Normal rate and regular rhythm.     Heart sounds: Normal heart sounds. No murmur. No friction rub. No gallop.   Pulmonary:     Effort: Pulmonary effort is normal. No respiratory distress.     Breath sounds: Normal breath sounds.  Abdominal:     General: Bowel sounds are normal. There  is no distension.     Palpations: Abdomen is soft.     Tenderness: There is no abdominal tenderness. There is no guarding.  Musculoskeletal: Normal range of motion.  Skin:    General: Skin is warm and dry.     Findings: No rash.  Neurological:     Mental Status: She is alert.     Motor: No abnormal muscle tone.     Coordination: Coordination normal.      ED Treatments / Results  Labs (all labs ordered are listed, but only abnormal results are displayed) Labs Reviewed  COMPREHENSIVE METABOLIC PANEL - Abnormal; Notable for the following components:      Result Value   Alkaline Phosphatase 37 (*)    All other components within normal limits  ACETAMINOPHEN LEVEL - Abnormal; Notable for the following components:   Acetaminophen (Tylenol), Serum <10 (*)    All other components within normal limits  CBC - Abnormal; Notable for the following components:   WBC 3.4 (*)    All other components within normal limits  ETHANOL  SALICYLATE LEVEL  RAPID URINE DRUG SCREEN, HOSP PERFORMED  I-STAT BETA HCG BLOOD, ED ([facility], [facility], [facility] ONLY)    EKG None  Radiology No results found.  Procedures Procedures (including critical care time)  Medications Ordered in ED Medications - No data to display   Initial Impression / Assessment and Plan / ED Course  I have reviewed the triage vital signs and the nursing notes.  Pertinent labs & imaging results that were available during my care of the patient were reviewed by me and considered in my medical decision making (see chart for details).        28 year old female with a past medical history of depression presenting to the ED with suicidal ideation with plan to wreck her car.  Reports suicide attempts in the past as a teenager.  She denies any HI, AVH.  Medical screening lab work is unremarkable.  I have ordered a TTS evaluation for final disposition.  3:15 PM TTS states patient does not meet inpatient criteria.  They faxed over resources and  I will include a resource guide as well.  Patient agreeable to the plan.  Patient is hemodynamically stable, in NAD, and able to ambulate in the ED. Evaluation does not show pathology that would require ongoing emergent intervention or inpatient treatment. I explained the diagnosis to the patient. Pain has been managed and has no  complaints prior to discharge. Patient is comfortable with above plan and is stable for discharge at this time. All questions were answered prior to disposition. Strict return precautions for returning to the ED were discussed. Encouraged follow up with PCP.   An After Visit Summary was printed and given to the patient.   Portions of this note were generated with [employer]. Dictation errors may occur despite best attempts at proofreading.  Final Clinical Impressions(s) / ED Diagnoses   Final diagnoses:  Suicidal ideation    ED Discharge Orders    None           Delia Heady, PA-C 03/09/19 1521    Maudie Flakes, MD 03/10/19 [phone]

## 2019-03-09 NOTE — ED Triage Notes (Signed)
Pt reports suicidal thoughts this morning. Has a therapist that recommended patient come to ER. Denies active plan. Denies AV/hallucinations. Pt calm, cooperative, alert, oriented x4.

## 2019-03-09 NOTE — B[facility] Counselor (Signed)
Disposition: Per Mordecai Maes, NP patient does not meet in patient criteria. CSW to send referrals for outpatient psychiatry. Luellen Pucker, RN informed of disposition.

## 2019-03-09 NOTE — Progress Notes (Signed)
Outpatient resources have been faxed to [facility] ED for pt.   Audree Camel, LCSW, [place] Disposition [facility]/TTS [phone]

## 2019-03-09 NOTE — ED Notes (Signed)
All belongings except for pt cell phone given to pt's mother, Ms Hart Carwin.

## 2019-03-09 NOTE — BH Assessment (Addendum)
Tele Assessment Note   Patient Name: Crystal Howell MRN: [id number] Referring Physician: Sedonia Small Location of Patient: [facility] ED Location of Provider: [facility] is an 28 y.o. female presenting voluntarily to [facility] ED for assessment after being referred by her outpatient therapist. Patient states she has been feeling depressed since April when her grandmother passed away and worsening anxiety due to job stress. She states today she was driving to work and thinking of wrecking her car. She states she does not have any intent to do this. Patient's therapist at [facility] referred to ED for assessment. She endorses depressive symptoms of hopelessness, irritability, worthlessness, social isolation, poor appetite, insomnia, anhedonia, tearfulness, and guilt. She denies HI/AVH, substance use, trauma, or criminal charges. Patient denies any family history of mental illness. Patient gave verbal consent for TTS to contact her mother, [name] [phone] for collateral information.  Per collateral: Patient lives in the home with her. She has noticed that patient's mood has been more depressed the last week and her anxiety has increased. She states that she does not feel patient would do anything to hurt herself or anyone else and that she can take time off work to help patient get services with psychiatry.  Patient is alert and oriented x 4. She is dressed in scrubs. Her speech is logical, eye contact is good, and her thoughts are organized. Patient's mood is depressed and her affect is congruent. Her insight, judgement, and impulse control are intact. She does not appear to be responding to internal stimuli or experiencing delusional thought content.  Diagnosis: F33.2 MDD, recurrent, severe  Past Medical History:  Past Medical History:  Diagnosis Date  . Allergy   . Anemia   . Chiari malformation   . Depression   . Fibroids   . Headache(784.0)    . Hx of menorrhagia   . Thyroid disease     Past Surgical History:  Procedure Laterality Date  . WISDOM TOOTH EXTRACTION      Family History:  Family History  Problem Relation Age of Onset  . Thyroid disease Mother   . Anemia Mother   . Heart disease Maternal Grandmother   . Emphysema Maternal Grandmother   . Stroke Maternal Grandmother   . Mental illness Maternal Grandmother   . Lung cancer Maternal Grandfather   . Liver disease Father   . Cancer Paternal Aunt     Social History:  reports that she has never smoked. She has never used smokeless tobacco. She reports that she does not drink alcohol or use drugs.  Additional Social History:  Alcohol / Drug Use Pain Medications: see MAR Prescriptions: see MAR Over the Counter: see MAR History of alcohol / drug use?: No history of alcohol / drug abuse  CIWA: CIWA-Ar BP: 115/74 Pulse Rate: (!) 107 COWS:    Allergies:  Allergies  Allergen Reactions  . Doxycycline   . Erythromycin   . Nabumetone     Stomach upset    Home Medications: (Not in a hospital admission)   OB/GYN Status:  Patient's last menstrual period was 03/01/2019.  General Assessment Data Location of Assessment: [facility] Assessment Services TTS Assessment: In system Is this a Tele or Face-to-Face Assessment?: Tele Assessment Is this an Initial Assessment or a Re-assessment for this encounter?: Initial Assessment Patient Accompanied by:: N/A Language Other than English: No Living Arrangements: Other (Comment)(mother's home) What gender do you identify as?: Female Marital status: Single Maiden name: Noblitt Pregnancy Status:  No Living Arrangements: Parent Can pt return to current living arrangement?: Yes Admission Status: Voluntary Is patient capable of signing voluntary admission?: Yes Referral Source: Self/Family/Friend Insurance type: [place] Living Arrangements: Parent Legal Guardian: (self) Name of Psychiatrist:  none Name of Therapist: Mary Sella- [facility]  Education Status Is patient currently in school?: No Is the patient employed, unemployed or receiving disability?: Employed  Risk to self with the past 6 months Suicidal Ideation: Yes-Currently Present Has patient been a risk to self within the past 6 months prior to admission? : No Suicidal Intent: No Has patient had any suicidal intent within the past 6 months prior to admission? : No Is patient at risk for suicide?: No Suicidal Plan?: No Has patient had any suicidal plan within the past 6 months prior to admission? : No Access to Means: No What has been your use of drugs/alcohol within the last 12 months?: denies Previous Attempts/Gestures: Yes How many times?: 1 Other Self Harm Risks: none Triggers for Past Attempts: Other (Comment)(work stress) Intentional Self Injurious Behavior: None Family Suicide History: No Recent stressful life event(s): Loss (Comment)(grandmother) Persecutory voices/beliefs?: No Depression: Yes Depression Symptoms: Despondent, Tearfulness, Insomnia, Isolating, Fatigue, Guilt, Loss of interest in usual pleasures, Feeling worthless/self pity, Feeling angry/irritable Substance abuse history and/or treatment for substance abuse?: No Suicide prevention information given to non-admitted patients: Not applicable  Risk to Others within the past 6 months Homicidal Ideation: No Does patient have any lifetime risk of violence toward others beyond the six months prior to admission? : No Thoughts of Harm to Others: No Current Homicidal Intent: No Current Homicidal Plan: No Access to Homicidal Means: No Identified Victim: none History of harm to others?: No Assessment of Violence: None Noted Violent Behavior Description: none noted Does patient have access to weapons?: No Criminal Charges Pending?: No Does patient have a court date: No Is patient on probation?: No  Psychosis Hallucinations: None  noted Delusions: None noted  Mental Status Report Appearance/Hygiene: In scrubs Eye Contact: Good Motor Activity: Freedom of movement Speech: Logical/coherent Level of Consciousness: Alert Mood: Depressed Affect: Appropriate to circumstance Anxiety Level: None Thought Processes: Coherent, Relevant Judgement: Partial Orientation: Person, Place, Time, Situation Obsessive Compulsive Thoughts/Behaviors: None  Cognitive Functioning Concentration: Normal Memory: Recent Intact, Remote Intact Is patient IDD: No Insight: Fair Impulse Control: Fair Appetite: Poor Have you had any weight changes? : Loss Amount of the weight change? (lbs): (UTA) Sleep: Decreased Total Hours of Sleep: (intermittent) Vegetative Symptoms: Decreased grooming, Not bathing  ADLScreening [facility] Assessment Services) Patient's cognitive ability adequate to safely complete daily activities?: Yes Patient able to express need for assistance with ADLs?: Yes Independently performs ADLs?: Yes (appropriate for developmental age)  Prior Inpatient Therapy Prior Inpatient Therapy: No  Prior Outpatient Therapy Prior Outpatient Therapy: Yes Prior Therapy Dates: ongoing Prior Therapy Facilty/Provider(s): [facility] Reason for Treatment: depression and anxiety Does patient have an ACCT team?: No Does patient have Intensive In-House Services?  : No Does patient have [facility] services? : No Does patient have P4CC services?: No  ADL Screening (condition at time of admission) Patient's cognitive ability adequate to safely complete daily activities?: Yes Is the patient deaf or have difficulty hearing?: No Does the patient have difficulty seeing, even when wearing glasses/contacts?: No Does the patient have difficulty concentrating, remembering, or making decisions?: No Patient able to express need for assistance with ADLs?: Yes Does the patient have difficulty dressing or bathing?: No Independently performs  ADLs?:  Yes (appropriate for developmental age) Does the patient have difficulty walking or climbing stairs?: No Weakness of Legs: None Weakness of Arms/Hands: None  Home Assistive Devices/Equipment Home Assistive Devices/Equipment: None  Therapy Consults (therapy consults require a physician order) PT Evaluation Needed: No OT Evalulation Needed: No SLP Evaluation Needed: No Abuse/Neglect Assessment (Assessment to be complete while patient is alone) Abuse/Neglect Assessment Can Be Completed: Yes Physical Abuse: Denies Verbal Abuse: Denies Sexual Abuse: Denies Exploitation of patient/patient's resources: Denies Self-Neglect: Denies Values / Beliefs Cultural Requests During Hospitalization: None Spiritual Requests During Hospitalization: None Consults Spiritual Care Consult Needed: No Social Work Consult Needed: No Regulatory affairs officer (For Healthcare) Does Patient Have a Medical Advance Directive?: No Would patient like information on creating a medical advance directive?: No - Patient declined          Disposition: Per Mordecai Maes, NP patient does not meet in patient criteria. CSW to send referrals for outpatient psychiatry. Luellen Pucker, RN informed of disposition. Disposition Initial Assessment Completed for this Encounter: Yes  This service was provided via telemedicine using a 2-way, interactive audio and video technology.  Names of all persons participating in this telemedicine service and their role in this encounter. Name: Crystal Howell Role: patient  Name: Orvis Brill, LCSW Role: TTS  Name: Mordecai Maes, NP Role: provider  Name:  Role:     Orvis Brill 03/09/2019 12:39 PM

## 2019-03-09 NOTE — ED Notes (Signed)
Patient verbalizes understanding of discharge instructions . Opportunity for questions and answers were provided . Armband removed by staff ,Pt discharged from ED. W/C  offered at D/C  and Declined W/C at D/C and was escorted to lobby by RN.  

## 2019-03-09 NOTE — ED Notes (Signed)
Called staffing for sitter.   They stated no sitters presently.  Lunch ordered.

## 2019-04-30 IMAGING — DX DG ANKLE COMPLETE 3+V*L*
4 series · 4 of 4 positions shown · non-contrast
Comparison: None.

CLINICAL DATA: Initial evaluation for acute left ankle pain.

EXAM:
LEFT ANKLE COMPLETE - 3+ VIEW

[ankle ap]
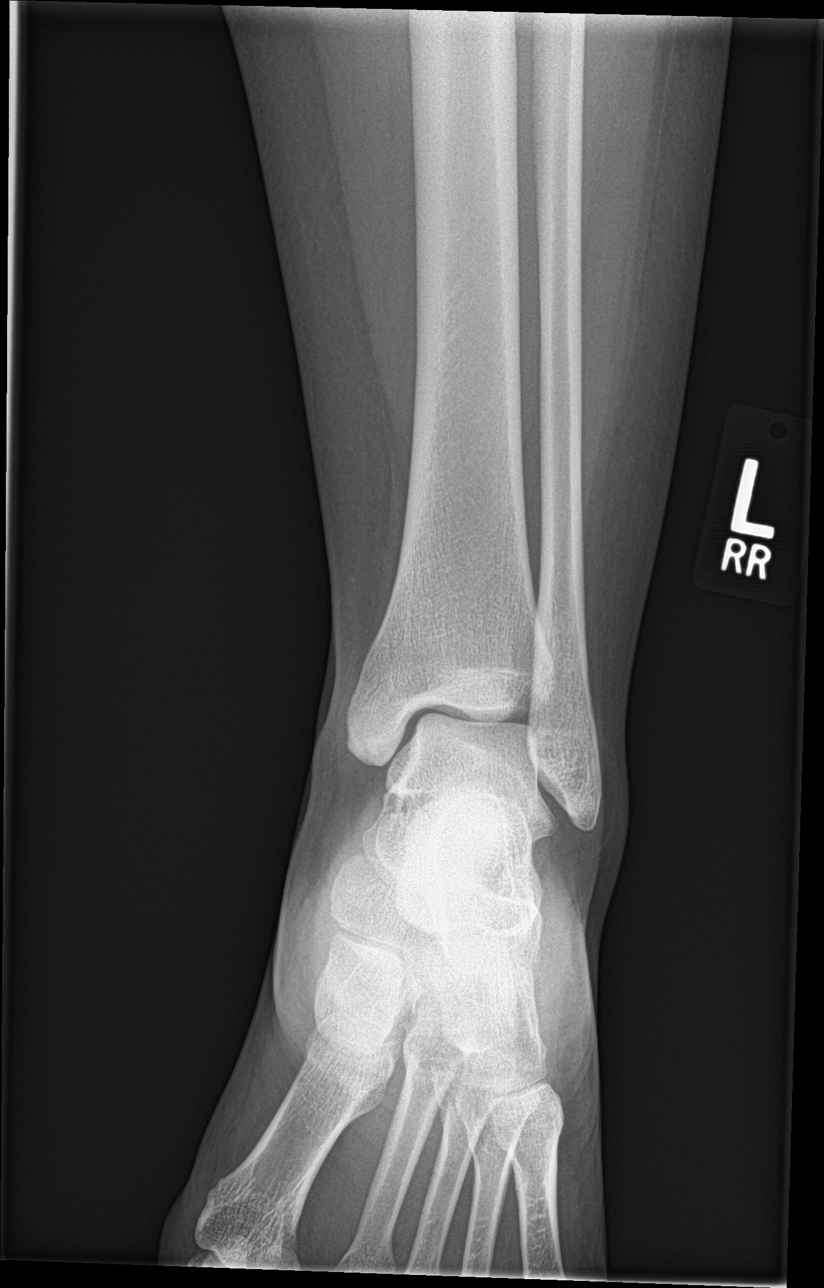

[ankle obl (1 of 2)]
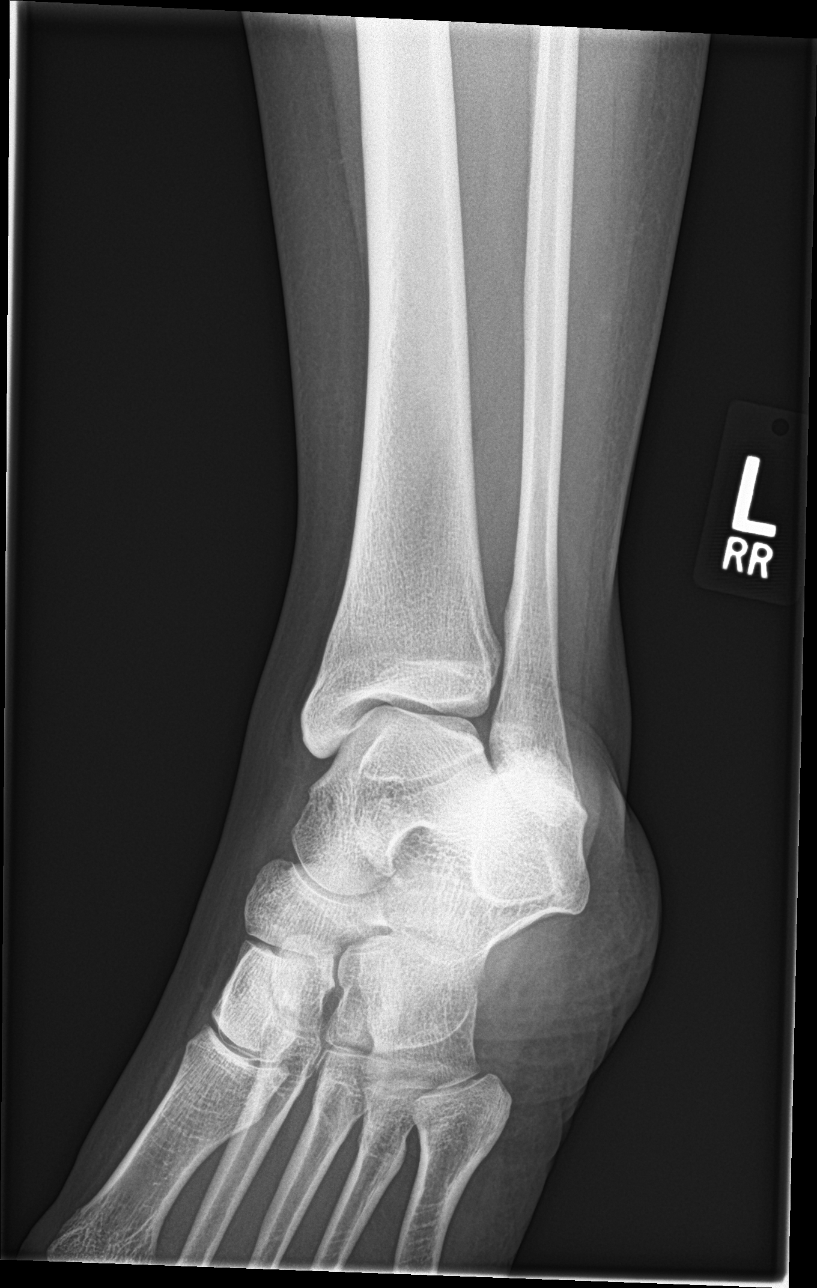

[ankle obl (2 of 2)]
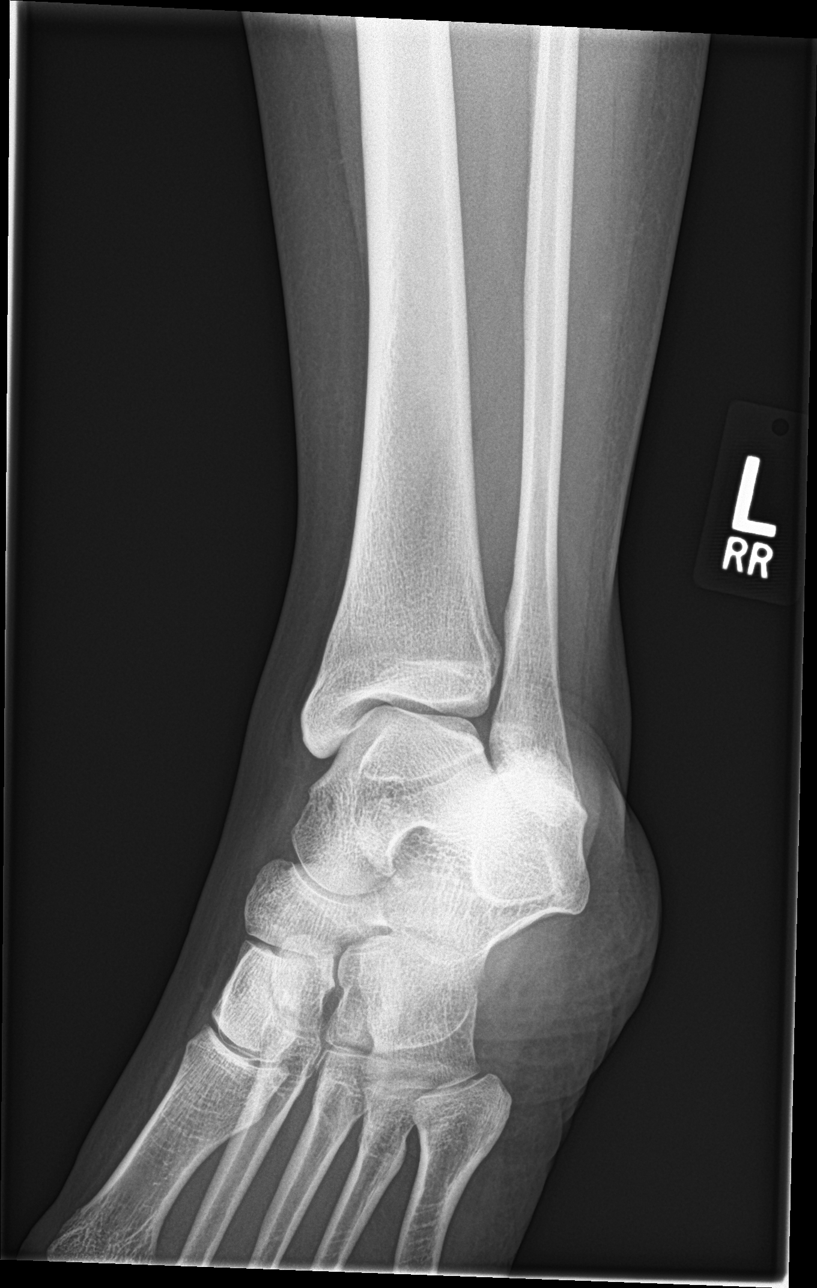

[ankle lat]
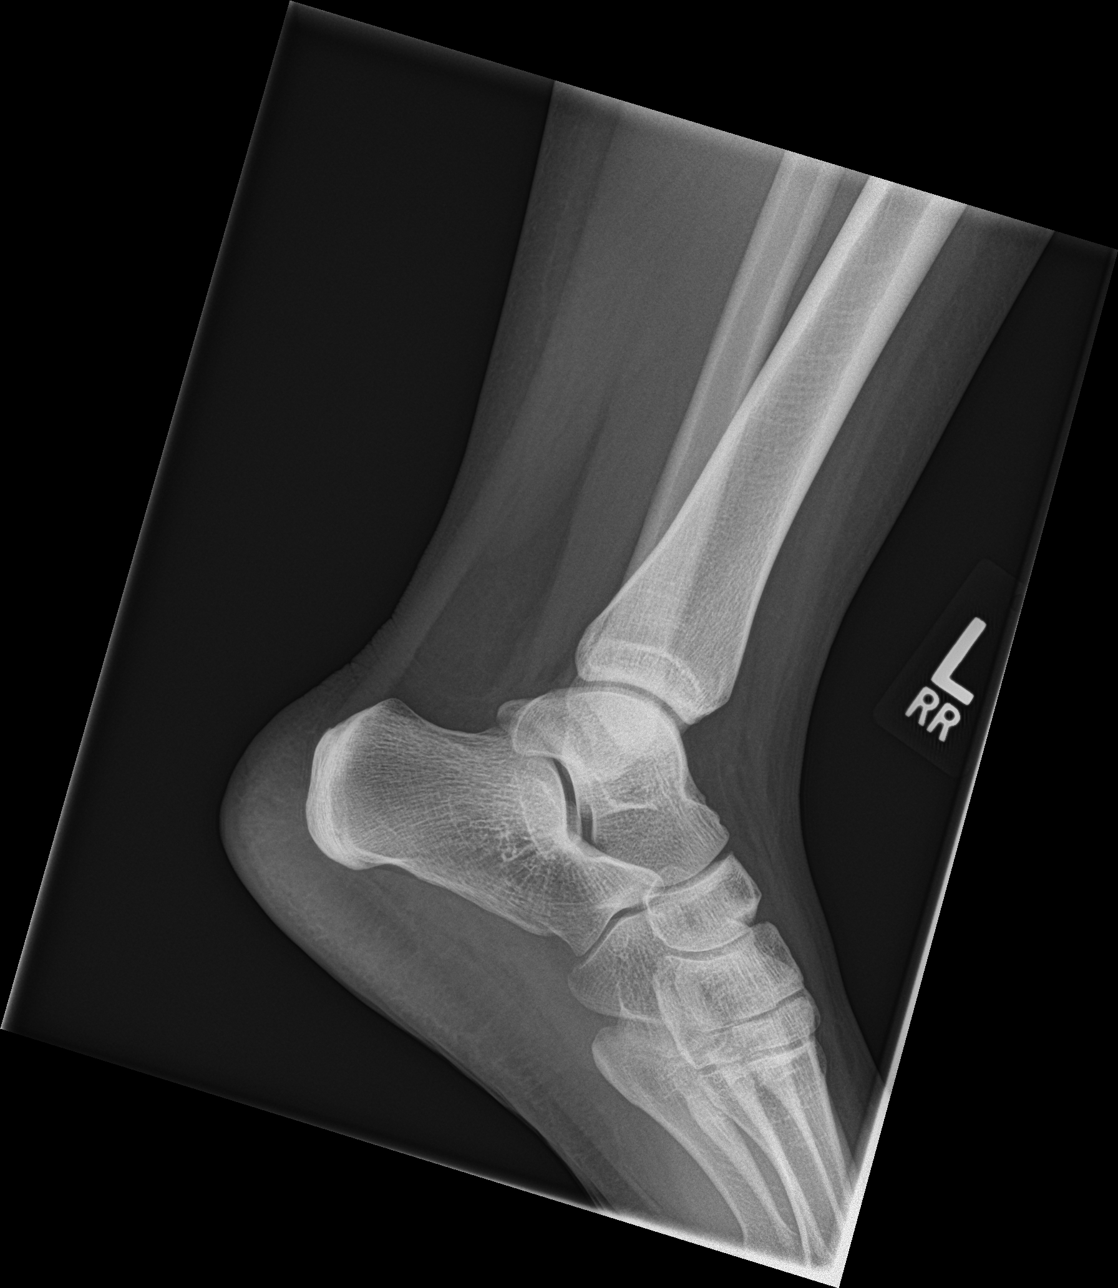

[4 of 4 positions shown; findings below may reference images not displayed]

FINDINGS: There is no evidence of fracture, dislocation, or joint effusion.
There is no evidence of arthropathy or other focal bone abnormality.
Soft tissues are unremarkable.
IMPRESSION: No acute osseous abnormality about the left ankle.

## 2020-09-19 ENCOUNTER — Other Ambulatory Visit (HOSPITAL_COMMUNITY): Payer: Self-pay

## 2020-11-03 ENCOUNTER — Encounter: Payer: Self-pay | Admitting: Physician Assistant

## 2020-11-03 DIAGNOSIS — F32A Depression, unspecified: Secondary | ICD-10-CM | POA: Insufficient documentation

## 2020-11-03 DIAGNOSIS — F329 Major depressive disorder, single episode, unspecified: Secondary | ICD-10-CM | POA: Insufficient documentation

## 2020-11-09 ENCOUNTER — Telehealth (HOSPITAL_BASED_OUTPATIENT_CLINIC_OR_DEPARTMENT_OTHER): Payer: Self-pay

## 2020-11-09 NOTE — Telephone Encounter (Signed)
Pt left VM about getting earlier NP appt - LVM stating she would have to keep trying due to no Wait List

## 2020-11-21 ENCOUNTER — Encounter: Payer: Self-pay | Admitting: Emergency Medicine

## 2020-11-21 ENCOUNTER — Other Ambulatory Visit: Payer: Self-pay

## 2020-11-21 ENCOUNTER — Ambulatory Visit
Admission: EM | Admit: 2020-11-21 | Discharge: 2020-11-21 | Disposition: A | Discharge status: Home / Self Care | Payer: 59 | Attending: Emergency Medicine | Admitting: Emergency Medicine

## 2020-11-21 DIAGNOSIS — K5900 Constipation, unspecified: Secondary | ICD-10-CM | POA: Insufficient documentation

## 2020-11-21 DIAGNOSIS — R300 Dysuria: Secondary | ICD-10-CM | POA: Diagnosis not present

## 2020-11-21 LAB — POCT URINALYSIS DIP (MANUAL ENTRY)
Bilirubin, UA: NEGATIVE
Blood, UA: NEGATIVE
Glucose, UA: NEGATIVE mg/dL
Nitrite, UA: NEGATIVE
Protein Ur, POC: NEGATIVE mg/dL
Spec Grav, UA: 1.025 (ref 1.010–1.025)
Urobilinogen, UA: 0.2 E.U./dL
pH, UA: 6 (ref 5.0–8.0)

## 2020-11-21 LAB — POCT URINE PREGNANCY: Preg Test, Ur: NEGATIVE

## 2020-11-21 NOTE — ED Provider Notes (Signed)
ReportSubjective:    [name] is a very pleasant 30 y.o. female who presents with concerns for UTI vs constipation due to dysuria and left low back pain since Friday.  Is that this typically occurs when she is constipated.  She reports that her last bowel movement was this morning and reports that the bowel movement was very small and hard.  Patient states that she is having some burning after urination and states that she has had stool that is hard to pass.  Patient reports some lower abdominal discomfort at times. No vomiting, fever, vaginal discharge, concern for STD.  She does not report use of any over-the-counter medication such as stool softeners or laxatives.  Past medical history, past surgical history, current medications reviewed.  Allergies: is allergic to doxycycline, erythromycin, and nabumetone.  Review of Systems See HPI   Objective:     Vitals:   11/21/20 1727  BP: 122/70  Pulse: 84  Resp: 16  Temp: 98.5 F (36.9 C)  SpO2: 98%     General: Appears well-developed and well-nourished. No acute distress.  Cardiovascular: Normal rate. Pulm/Chest: No respiratory distress. Abdominal: Soft, non-distended, and non-tender without rebound or guarding. Bowel tones normotonic in all quadrants. No masses or hepatosplenomegaly. No CVAT.  Neurological: Alert and oriented to person, place, and time.  Skin: Skin is warm and dry.  Psychiatric: Normal mood, affect, behavior, and thought content.  GU:  Deferred secondary to self collect specimen  Laboratory:  Orders Placed This Encounter  Procedures   Urine Culture   POCT urinalysis dipstick   POCT urine pregnancy   Results for orders placed or performed during the hospital encounter of 11/21/20  POCT urinalysis dipstick  Result Value Ref Range   Color, UA yellow yellow   Clarity, UA clear clear   Glucose, UA negative negative mg/dL   Bilirubin, UA negative negative   Ketones, POC UA trace (5) (A) negative mg/dL    Spec Grav, UA 1.025 1.010 - 1.025   Blood, UA negative negative   pH, UA 6.0 5.0 - 8.0   Protein Ur, POC negative negative mg/dL   Urobilinogen, UA 0.2 0.2 or 1.0 E.U./dL   Nitrite, UA Negative Negative   Leukocytes, UA Trace (A) Negative  POCT urine pregnancy  Result Value Ref Range   Preg Test, Ur Negative Negative    -Urinalysis reveals trace leukocytes and trace ketones -Urine culture pending. -hCG negative Assessment:   1. Dysuria - Urine Culture; Standing - Urine Culture  2. Constipation, unspecified constipation type   Plan:   MDM: Patient presents with concerns for UTI vs constipation due to dysuria and left low back pain since Friday.  Is that this typically occurs when she is constipated.  She reports that her last bowel movement was this morning and reports that the bowel movement was very small and hard.  Patient states that she is having some burning after urination and states that she has had stool that is hard to pass.  Patient reports some lower abdominal discomfort at times. No vomiting, fever, vaginal discharge, concern for STD.  She does not report use of any over-the-counter medication such as stool softeners or laxatives.  Chart review completed.  Given symptoms along with assessment findings and urinalysis results which reveal trace ketones and trace leukocytes, low suspicion for UTI, but will send urine for culture.  Likely constipation as this is the patient's typical presentation when she is constipated.  Pregnancy negative today.  Advised use of  mag citrate and advised to drink half of the bottle and then if this does not produce a bowel movement to drink the other 2 hours.  Advised that we will call with any positive results from her urine culture for which she would need antibiotics, but will hold antibiotics at this time.  Advised to go to the ER for nausea, vomiting, worsening back pain or worsening urinary symptoms.  Patient verbalized understanding and  agreed with plan.  Patient stable upon discharge.  Return as needed.    Discharge Instructions      Your urine sample today was sent for a culture.  If your urine indicates that you have an infection in the urine, we will call you to initiate antibiotic therapy.  If you do not receive a call in the next 3-4 days, it means that you do not need antibiotics for a urinary tract infection.  Please go to the ER for continued nausea, vomiting, unilateral back pain, or worsening urinary symptoms.   Drink half a bottle of magnesium citrate then if this does not produce a bowel movement drink the other half of the bottle after 2 hours.         Crystal Howell, [place] 11/21/20 1751

## 2020-11-21 NOTE — Discharge Instructions (Addendum)
Your urine sample today was sent for a culture.  If your urine indicates that you have an infection in the urine, we will call you to initiate antibiotic therapy.  If you do not receive a call in the next 3-4 days, it means that you do not need antibiotics for a urinary tract infection.  Please go to the ER for continued nausea, vomiting, unilateral back pain, or worsening urinary symptoms.   Drink half a bottle of magnesium citrate then if this does not produce a bowel movement drink the other half of the bottle after 2 hours.

## 2020-11-21 NOTE — ED Triage Notes (Signed)
Pt here wit burning on urination and left flank pain since Friday. States this also happens when she gets constipated. LBM this morning was very small. Has not tried any OTC meds for the constipation

## 2020-11-23 LAB — URINE CULTURE: Culture: <10000 — AB

## 2020-12-20 ENCOUNTER — Encounter (HOSPITAL_BASED_OUTPATIENT_CLINIC_OR_DEPARTMENT_OTHER): Payer: Self-pay | Admitting: Family Medicine

## 2020-12-20 ENCOUNTER — Ambulatory Visit (HOSPITAL_BASED_OUTPATIENT_CLINIC_OR_DEPARTMENT_OTHER): Payer: 59 | Admitting: Family Medicine

## 2020-12-20 ENCOUNTER — Other Ambulatory Visit: Payer: Self-pay

## 2020-12-20 VITALS — BP 110/64 | HR 105 | Ht 64.0 in | Wt 186.6 lb

## 2020-12-20 DIAGNOSIS — F329 Major depressive disorder, single episode, unspecified: Secondary | ICD-10-CM

## 2020-12-20 DIAGNOSIS — E669 Obesity, unspecified: Secondary | ICD-10-CM | POA: Diagnosis not present

## 2020-12-20 DIAGNOSIS — D649 Anemia, unspecified: Secondary | ICD-10-CM | POA: Insufficient documentation

## 2020-12-20 DIAGNOSIS — F32A Depression, unspecified: Secondary | ICD-10-CM

## 2020-12-20 DIAGNOSIS — R5383 Other fatigue: Secondary | ICD-10-CM | POA: Insufficient documentation

## 2020-12-20 DIAGNOSIS — E039 Hypothyroidism, unspecified: Secondary | ICD-10-CM | POA: Diagnosis not present

## 2020-12-20 NOTE — Patient Instructions (Signed)
  Medication Instructions:  Your physician has recommended you make the following change in your medication:  -- STOP Bupropion (Wellbutrin) --If you need a refill on any your medications before your next appointment, please call your pharmacy first. If no refills are authorized on file call the office.--  Lab Work: Your physician has recommended that you have lab work today: CBC, BMET, Vitamin D, Vitamin B12, and TSH If you have labs (blood work) drawn today and your tests are completely normal, you will receive your results via [place] a phone call from our staff.  Please ensure you check your voicemail in the event that you authorized detailed messages to be left on a delegated number. If you have any lab test that is abnormal or we need to change your treatment, we will call you to review the results.  Follow-Up: Your next appointment:   Your physician recommends that you schedule a follow-up appointment in: 4-6 WEEKS with Dr. de [place]  Thanks for letting us be apart of your health journey!!  Primary Care and Sports Medicine   Dr. Arlina Robes [place]   We encourage you to activate your patient portal called "MyChart".  Sign up information is provided on this After Visit Summary.  MyChart is used to connect with patients for Virtual Visits (Telemedicine).  Patients are able to view lab/test results, encounter notes, upcoming appointments, etc.  Non-urgent messages can be sent to your provider as well. To learn more about what you can do with MyChart, please visit --  [email or web address].

## 2020-12-20 NOTE — Progress Notes (Signed)
New Patient Office Visit  Subjective:  Patient ID: Crystal Howell, female    DOB: 1991/01/17  Age: 30 y.o. MRN: [id number]  CC:  Chief Complaint  Patient presents with   Establish Care    Prior PCP - [facility]   Medication Problem    Patient would like to be weaned off Wellbutrin. She states she doesn't feel like the medication is as effective as it once was and she feels its making her more anxious and feel worse then better. She is established with [facility] in [place] for therapy   Fatigue    Patient is requesting to have Vit D, Thyroid, and other labs checked due to her having a lot of fatigue and leg cramping.    HPI Crystal Howell is a 30 year old female presenting to establish in clinic.  She has current concerns as outlined above.  She reports past medical history of anxiety/depression, hypothyroidism, anemia.  Hypothyroidism: Presently taking levothyroxine 50 mcg daily.  Thinks that last TSH check was in May.  She was originally diagnosed with hypothyroidism about 5 years ago.  Has been on current dose of levothyroxine for a few years.  Anemia: Reports that this is due to known uterine fibroids, heavy menstrual flow.  She is currently taking ferrous gluconate. Does have some fatigue which is a chronic issue for patient. Has not had CBC checked recently.  Anxiety/depression: has been managed with Wellbutrin XL most recently. She also sees provider at [facility] for counseling. She was started on current medication with Sharon Seller at [facility]. Today, she is requesting to stoppage medication. Reasoning for this is that she feels that it is not as effective as it used to be and it is contributing to increased symptoms. Today, her PHQ-9 and GAD-7 are 8 and 9, respectively. She reports that she has been on sertraline in the past but had a "bad reaction".  Follows with [facility]. Follows with [facility] for counseling  services. 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  Past Medical History:  Diagnosis Date   Allergy    Anemia    Anxiety 2020   Chiari malformation    Depression    Fibroids    Headache(784.0)    Hx of menorrhagia    Thyroid disease     Past Surgical History:  Procedure Laterality Date   WISDOM TOOTH EXTRACTION      Family History  Problem Relation Age of Onset   Thyroid disease Mother    Anemia Mother    Heart disease Maternal Grandmother    Emphysema Maternal Grandmother    Stroke Maternal Grandmother    Mental illness Maternal Grandmother    Lung cancer Maternal Grandfather    Heart disease Maternal Grandfather    Liver disease Father    Cancer Paternal Aunt    Cancer Maternal Aunt     Social History   Socioeconomic History   Marital status: Single    Spouse name: Not on file   Number of children: 0   Years of education: 16   Highest education level: Not on file  Occupational History    Employer: [employer]    Comment: [employer]  Tobacco Use   Smoking status: Never   Smokeless tobacco: Never  Substance and Sexual Activity   Alcohol use: No   Drug  use: No   Sexual activity: Yes    Birth control/protection: None  Other Topics Concern   Not on file  Social History Narrative   Patient resides with mother, consumes rarely caffeine   Social Determinants of Health   Financial Resource Strain: Not on file  Food Insecurity: Not on file  Transportation Needs: Not on file  Physical Activity: Not on file  Stress: Not on file  Social Connections: Not on file  Intimate Partner Violence: Not on file    Objective:   Today's Vitals: BP 110/64   Pulse (!) 105   Ht '5\' 4"'$  (1.626 m)   Wt 186 lb 9.6 oz (84.6 kg)   LMP 11/28/2020   SpO2 99%   BMI 32.03 kg/m   Physical Exam  30 year old female in no acute distress Cardiovascular exam with  regular rate and rhythm, no murmurs appreciated Lungs clear to auscultation bilaterally  Assessment & Plan:   Problem List Items Addressed This Visit       Endocrine   Hypothyroidism    Has not had TSH checked in about 10 months. Check TSH today, adjustments to be made in dose of levothyroxine if needed based on TSH      Relevant Medications   levothyroxine (SYNTHROID) 50 MCG tablet   Other Relevant Orders   TSH (Completed)     Other   Depressive disorder - Primary    Discussed with patient risks and benefits of discontinuing pharmacotherapy.  She elected to proceed with discontinuation of bupropion Encouraged her to continue with follow-up with counselor whom she sees at least monthly [facility] We will plan for close follow-up to monitor symptoms She has been on sertraline in the past but reports "bad reaction" If symptoms progress once pharmacotherapy discontinued, may need to consider alternative treatment such as SNRI Also discussed possible referral to psychiatry, however patient declines at this time Cautioned that if patient develops any suicidal or homicidal ideation to contact the clinic or present to local emergency department.  Patient voiced understanding and agreement      Fatigue    Uncertain etiology, patient with history of intermittent anemia, currently taking oral iron supplement Check labs as below to evaluate for thyroid disease, vitamin deficiency Also check CBC to assess status of anemia By history, no suggestion of underlying sleep apnea.  Could also be related to underlying depression/anxiety      Relevant Orders   TSH (Completed)   VITAMIN D 25 Hydroxy (Vit-D Deficiency, Fractures) (Completed)   Vitamin B12 (Completed)   Anemia    History of anemia related to heavy menstrual cycles Will check CBC and labs below to further evaluate Can continue with oral iron supplementation at this time      Relevant Medications   Ferrous  Gluconate 239 (27 Fe) MG TABS   Other Relevant Orders   Basic metabolic panel (Completed)   CBC with Differential/Platelet (Completed)   Obesity, Class I, BMI 30-34.9    Recommend lifestyle modifications to work towards gradual, healthy weight loss.  This includes dietary changes as well as gradual increase in weekly physical activity. Consider referral to nutritionist Follow-up on BMI at future office visits       Outpatient Encounter Medications as of 12/20/2020  Medication Sig   buPROPion (WELLBUTRIN XL) 150 MG 24 hr tablet Take 150 mg by mouth daily.   Ferrous Gluconate 239 (27 Fe) MG TABS Take 1 tablet by mouth daily.   fluticasone (FLONASE) 50 MCG/ACT nasal spray  Place 1 spray into both nostrils daily.   levothyroxine (SYNTHROID) 50 MCG tablet Take 50 mcg by mouth daily before breakfast.   vitamin C (ASCORBIC ACID) 250 MG tablet Take 250 mg by mouth daily.   No facility-administered encounter medications on file as of 12/20/2020.    Follow-up: Return in about 6 weeks (around 01/31/2021).  Plan for follow-up in about 6 weeks or sooner as needed  Tanaiya Kolarik J De [place], MD

## 2020-12-21 LAB — BASIC METABOLIC PANEL
BUN/Creatinine Ratio: 11 (ref 9–23)
BUN: 9 mg/dL (ref 6–20)
CO2: 21 mmol/L (ref 20–29)
Calcium: 9.4 mg/dL (ref 8.7–10.2)
Chloride: 103 mmol/L (ref 96–106)
Creatinine, Ser: 0.82 mg/dL (ref 0.57–1.00)
Glucose: 105 mg/dL — ABNORMAL HIGH (ref 65–99)
Potassium: 3.8 mmol/L (ref 3.5–5.2)
Sodium: 138 mmol/L (ref 134–144)
eGFR: 98 mL/min/{1.73_m2} (ref >59)

## 2020-12-21 LAB — CBC WITH DIFFERENTIAL/PLATELET
Basophils Absolute: 0 10*3/uL (ref 0.0–0.2)
Basos: 1 %
EOS (ABSOLUTE): 0.1 10*3/uL (ref 0.0–0.4)
Eos: 1 %
Hematocrit: 35.8 % (ref 34.0–46.6)
Hemoglobin: 11.7 g/dL (ref 11.1–15.9)
Immature Grans (Abs): 0 10*3/uL (ref 0.0–0.1)
Immature Granulocytes: 0 %
Lymphocytes Absolute: 1.4 10*3/uL (ref 0.7–3.1)
Lymphs: 27 %
MCH: 29.3 pg (ref 26.6–33.0)
MCHC: 32.7 g/dL (ref 31.5–35.7)
MCV: 90 fL (ref 79–97)
Monocytes Absolute: 0.3 10*3/uL (ref 0.1–0.9)
Monocytes: 7 %
Neutrophils Absolute: 3.2 10*3/uL (ref 1.4–7.0)
Neutrophils: 64 %
Platelets: 282 10*3/uL (ref 150–450)
RBC: 4 x10E6/uL (ref 3.77–5.28)
RDW: 13.8 % (ref 11.7–15.4)
WBC: 5 10*3/uL (ref 3.4–10.8)

## 2020-12-21 LAB — TSH: TSH: 1.08 u[IU]/mL (ref 0.450–4.500)

## 2020-12-21 LAB — VITAMIN D 25 HYDROXY (VIT D DEFICIENCY, FRACTURES): Vit D, 25-Hydroxy: 5.6 ng/mL — ABNORMAL LOW (ref 30.0–100.0)

## 2020-12-21 LAB — VITAMIN B12: Vitamin B-12: 238 pg/mL (ref 232–1245)

## 2020-12-21 NOTE — Assessment & Plan Note (Signed)
Recommend lifestyle modifications to work towards gradual, healthy weight loss.  This includes dietary changes as well as gradual increase in weekly physical activity. Consider referral to nutritionist Follow-up on BMI at future office visits

## 2020-12-21 NOTE — Assessment & Plan Note (Signed)
History of anemia related to heavy menstrual cycles Will check CBC and labs below to further evaluate Can continue with oral iron supplementation at this time

## 2020-12-21 NOTE — Assessment & Plan Note (Signed)
Has not had TSH checked in about 10 months. Check TSH today, adjustments to be made in dose of levothyroxine if needed based on TSH

## 2020-12-21 NOTE — Assessment & Plan Note (Signed)
Uncertain etiology, patient with history of intermittent anemia, currently taking oral iron supplement Check labs as below to evaluate for thyroid disease, vitamin deficiency Also check CBC to assess status of anemia By history, no suggestion of underlying sleep apnea.  Could also be related to underlying depression/anxiety

## 2020-12-21 NOTE — Assessment & Plan Note (Signed)
Discussed with patient risks and benefits of discontinuing pharmacotherapy.  She elected to proceed with discontinuation of bupropion Encouraged her to continue with follow-up with counselor whom she sees at least monthly [facility] We will plan for close follow-up to monitor symptoms She has been on sertraline in the past but reports "bad reaction" If symptoms progress once pharmacotherapy discontinued, may need to consider alternative treatment such as SNRI Also discussed possible referral to psychiatry, however patient declines at this time Cautioned that if patient develops any suicidal or homicidal ideation to contact the clinic or present to local emergency department.  Patient voiced understanding and agreement

## 2020-12-22 ENCOUNTER — Other Ambulatory Visit (HOSPITAL_BASED_OUTPATIENT_CLINIC_OR_DEPARTMENT_OTHER): Payer: Self-pay

## 2020-12-22 ENCOUNTER — Other Ambulatory Visit: Payer: Self-pay

## 2020-12-22 ENCOUNTER — Ambulatory Visit
Admission: EM | Admit: 2020-12-22 | Discharge: 2020-12-22 | Disposition: A | Discharge status: Home / Self Care | Payer: 59 | Attending: Emergency Medicine | Admitting: Emergency Medicine

## 2020-12-22 DIAGNOSIS — R519 Headache, unspecified: Secondary | ICD-10-CM | POA: Diagnosis not present

## 2020-12-22 MED ORDER — IBUPROFEN 800 MG PO TABS: 800.0000 mg | ORAL_TABLET | Freq: Three times a day (TID) | ORAL | 0 refills | Status: DC | PRN

## 2020-12-22 MED ORDER — FLUTICASONE PROPIONATE 50 MCG/ACT NA SUSP: 1.0000 | Freq: Every day | NASAL | 0 refills | Status: DC

## 2020-12-22 MED ORDER — VITAMIN D (ERGOCALCIFEROL) 1.25 MG (50000 UNIT) PO CAPS: 50000.0000 [IU] | ORAL_CAPSULE | ORAL | 0 refills | Status: DC

## 2020-12-22 MED ORDER — CETIRIZINE HCL 10 MG PO CAPS: 10.0000 mg | ORAL_CAPSULE | Freq: Every day | ORAL | 0 refills | Status: DC

## 2020-12-22 NOTE — ED Triage Notes (Signed)
Pt reports having a headache, being lightheaded her PCP stated her Vitamin D was insufficient but she has not been prescribed any new medications.

## 2020-12-22 NOTE — Discharge Instructions (Signed)
COVID test pending, monitor my chart for results Ibuprofen/Tylenol for headache Begin daily cetirizine/Zyrtec and Flonase nasal spray to help with congestion/sinus pressure contributing to headache Rest and fluids Start weekly vitamin D supplement provided by primary care Please follow-up if any symptoms not improving or worsening

## 2020-12-22 NOTE — ED Provider Notes (Signed)
[facility]    CSN: [id number] Arrival date & time: 12/22/20  1043      History   Chief Complaint Chief Complaint  Patient presents with   Headache   Dizziness    HPI Crystal Howell is a 30 y.o. female presenting today for evaluation of headache and lightheadedness.  Reports yesterday began to develop headache described as a tightness sensation to frontal aspect of head.  Has had a sensation of lightheadedness which she describes as feeling off balance with changing position.  Reports mild congestion.  Slight chills and body aches.  Recently diagnosed with low vitamin D, but has not started medicine for this-she was evaluated due to fatigue.  Denies COVID exposure or close sick contacts.  HPI  Past Medical History:  Diagnosis Date   Allergy    Anemia    Anxiety 2020   Chiari malformation    Depression    Fibroids    Headache(784.0)    Hx of menorrhagia    Thyroid disease     Patient Active Problem List   Diagnosis Date Noted   Fatigue 12/20/2020   Anemia 12/20/2020   Obesity, Class I, BMI 30-34.9 12/20/2020   Hypothyroidism 12/20/2020   Depressive disorder 11/03/2020   Allergic rhinitis 12/12/2011    Past Surgical History:  Procedure Laterality Date   WISDOM TOOTH EXTRACTION      OB History     Gravida  0   Para      Term      Preterm      AB      Living         SAB      IAB      Ectopic      Multiple      Live Births               Home Medications    Prior to Admission medications   Medication Sig Start Date End Date Taking? Authorizing Provider  Cetirizine HCl 10 MG CAPS Take 1 capsule (10 mg total) by mouth daily for 10 days. 12/22/20 01/01/21 Yes Montrae Braithwaite C, PA-C  fluticasone (FLONASE) 50 MCG/ACT nasal spray Place 1-2 sprays into both nostrils daily. 12/22/20  Yes Kavion Mancinas C, PA-C  ibuprofen (ADVIL) 800 MG tablet Take 1 tablet (800 mg total) by mouth every 8 (eight) hours as needed for headache. With  food 12/22/20  Yes Vaniyah Lansky, [employer] C, PA-C  Ferrous Gluconate 239 (27 Fe) MG TABS Take 1 tablet by mouth daily.    [provider]  levothyroxine (SYNTHROID) 50 MCG tablet Take 50 mcg by mouth daily before breakfast.    [provider]  vitamin C (ASCORBIC ACID) 250 MG tablet Take 250 mg by mouth daily.    [provider]  Vitamin D, Ergocalciferol, (DRISDOL) 1.25 MG (50000 UNIT) CAPS capsule Take 1 capsule (50,000 Units total) by mouth every 7 (seven) days. 12/22/20   de [place], Raymond J, MD    Family History Family History  Problem Relation Age of Onset   Thyroid disease Mother    Anemia Mother    Heart disease Maternal Grandmother    Emphysema Maternal Grandmother    Stroke Maternal Grandmother    Mental illness Maternal Grandmother    Lung cancer Maternal Grandfather    Heart disease Maternal Grandfather    Liver disease Father    Cancer Paternal Aunt    Cancer Maternal Aunt     Social History Social  History   Tobacco Use   Smoking status: Never   Smokeless tobacco: Never  Substance Use Topics   Alcohol use: No   Drug use: No     Allergies   Doxycycline, Erythromycin, and Nabumetone   Review of Systems Review of Systems  Constitutional:  Positive for chills. Negative for activity change, appetite change, fatigue and fever.  HENT:  Positive for congestion, rhinorrhea and sore throat. Negative for ear pain, sinus pressure and trouble swallowing.   Eyes:  Negative for discharge and redness.  Respiratory:  Negative for cough, chest tightness and shortness of breath.   Cardiovascular:  Negative for chest pain.  Gastrointestinal:  Negative for abdominal pain, diarrhea, nausea and vomiting.  Musculoskeletal:  Positive for myalgias.  Skin:  Negative for rash.  Neurological:  Positive for headaches. Negative for dizziness and light-headedness.    Physical Exam Triage Vital Signs ED Triage Vitals  Enc Vitals Group     BP 12/22/20 1122 107/72      Pulse Rate 12/22/20 1122 73     Resp 12/22/20 1122 18     Temp 12/22/20 1122 98.2 F (36.8 C)     Temp Source 12/22/20 1122 Oral     SpO2 12/22/20 1122 98 %     Weight --      Height --      Head Circumference --      Peak Flow --      Pain Score 12/22/20 1119 7     Pain Loc --      Pain Edu? --      Excl. in [place]? --    No data found.  Updated Vital Signs BP 107/72 (BP Location: Left Arm)   Pulse 73   Temp 98.2 F (36.8 C) (Oral)   Resp 18   LMP 11/28/2020   SpO2 98%   Visual Acuity Right Eye Distance:   Left Eye Distance:   Bilateral Distance:    Right Eye Near:   Left Eye Near:    Bilateral Near:     Physical Exam Vitals and nursing note reviewed.  Constitutional:      Appearance: She is well-developed.     Comments: No acute distress  HENT:     Head: Normocephalic and atraumatic.     Ears:     Comments: Bilateral ears without tenderness to palpation of external auricle, tragus and mastoid, EAC's without erythema or swelling, TM's with good bony landmarks and cone of light. Non erythematous.      Nose: Nose normal.     Mouth/Throat:     Comments: Oral mucosa pink and moist, no tonsillar enlargement or exudate. Posterior pharynx patent and nonerythematous, no uvula deviation or swelling. Normal phonation.  Eyes:     Extraocular Movements: Extraocular movements intact.     Conjunctiva/sclera: Conjunctivae normal.     Pupils: Pupils are equal, round, and reactive to light.  Cardiovascular:     Rate and Rhythm: Normal rate.  Pulmonary:     Effort: Pulmonary effort is normal. No respiratory distress.     Comments: Breathing comfortably at rest, CTABL, no wheezing, rales or other adventitious sounds auscultated  Abdominal:     General: There is no distension.  Musculoskeletal:        General: Normal range of motion.     Cervical back: Neck supple.  Skin:    General: Skin is warm and dry.  Neurological:     General: No focal deficit present.  Mental Status: She is alert and oriented to person, place, and time. Mental status is at baseline.     Cranial Nerves: No cranial nerve deficit.     Motor: No weakness.     Gait: Gait normal.     UC Treatments / Results  Labs (all labs ordered are listed, but only abnormal results are displayed) Labs Reviewed  NOVEL CORONAVIRUS, NAA    EKG   Radiology No results found.  Procedures Procedures (including critical care time)  Medications Ordered in UC Medications - No data to display  Initial Impression / Assessment and Plan / UC Course  I have reviewed the triage vital signs and the nursing notes.  Pertinent labs & imaging results that were available during my care of the patient were reviewed by me and considered in my medical decision making (see chart for details).     Headache/viral URI-no neurodeficits, no red flags, has mild associated congestion and body aches, will screen for COVID, recommending symptomatic and supportive care with ibuprofen for headache, Zyrtec and Flonase for congestion and drainage 2.  Vitamin D deficiency-PCP prescribed 50,000 units weekly, advised patient of this prescription at pharmacy and to follow-up with PCP for recheck of vitamin D, likely will need daily supplementation due to low level.  Discussed strict return precautions. Patient verbalized understanding and is agreeable with plan.  Final Clinical Impressions(s) / UC Diagnoses   Final diagnoses:  Acute nonintractable headache, unspecified headache type     Discharge Instructions      COVID test pending, monitor my chart for results Ibuprofen/Tylenol for headache Begin daily cetirizine/Zyrtec and Flonase nasal spray to help with congestion/sinus pressure contributing to headache Rest and fluids Start weekly vitamin D supplement provided by primary care Please follow-up if any symptoms not improving or worsening     ED Prescriptions     Medication Sig Dispense Auth.  Provider   ibuprofen (ADVIL) 800 MG tablet Take 1 tablet (800 mg total) by mouth every 8 (eight) hours as needed for headache. With food 21 tablet Catherine Cubero C, PA-C   fluticasone (FLONASE) 50 MCG/ACT nasal spray Place 1-2 sprays into both nostrils daily. 16 g Cylan Borum C, PA-C   Cetirizine HCl 10 MG CAPS Take 1 capsule (10 mg total) by mouth daily for 10 days. 10 capsule Scott Fix, [place] C, PA-C      PDMP not reviewed this encounter.   Joneen Caraway Bruin C, PA-C 12/22/20 1231

## 2020-12-22 NOTE — Telephone Encounter (Signed)
Patient called and left voicemail stating that she was feeling really bad and wanted to have her vit d RX sent in Upon checking labs saw that patient was at an urgent care currently with complaints of lightheadedness and dizziness Prescription sent in to pharmacy on file.

## 2020-12-23 LAB — NOVEL CORONAVIRUS, NAA: SARS-CoV-2, NAA: NOT DETECTED

## 2020-12-23 LAB — SARS-COV-2, NAA 2 DAY TAT

## 2021-01-31 ENCOUNTER — Ambulatory Visit (HOSPITAL_BASED_OUTPATIENT_CLINIC_OR_DEPARTMENT_OTHER): Payer: 59 | Admitting: Family Medicine

## 2021-02-12 ENCOUNTER — Other Ambulatory Visit (HOSPITAL_BASED_OUTPATIENT_CLINIC_OR_DEPARTMENT_OTHER): Payer: Self-pay | Admitting: Family Medicine

## 2021-02-13 MED ORDER — VITAMIN D3 20 MCG (800 UNIT) PO TABS: 1.0000 | ORAL_TABLET | Freq: Every day | ORAL | 3 refills | Status: DC

## 2021-03-13 ENCOUNTER — Telehealth: Payer: Self-pay | Admitting: Emergency Medicine

## 2021-03-13 DIAGNOSIS — J019 Acute sinusitis, unspecified: Secondary | ICD-10-CM

## 2021-03-13 DIAGNOSIS — B9689 Other specified bacterial agents as the cause of diseases classified elsewhere: Secondary | ICD-10-CM

## 2021-03-13 MED ORDER — AMOXICILLIN-POT CLAVULANATE 875-125 MG PO TABS: 1.0000 | ORAL_TABLET | Freq: Two times a day (BID) | ORAL | 0 refills | Status: DC

## 2021-03-13 NOTE — Patient Instructions (Signed)
  Crystal Howell, thank you for joining Carvel Getting, NP for today's virtual visit.  While this provider is not your primary care provider (PCP), if your PCP is located in our provider database this encounter information will be shared with them immediately following your visit.  Consent: (Patient) Crystal Howell provided verbal consent for this virtual visit at the beginning of the encounter.  Current Medications:  Current Outpatient Medications:    amoxicillin-clavulanate (AUGMENTIN) 875-125 MG tablet, Take 1 tablet by mouth 2 (two) times daily., Disp: 14 tablet, Rfl: 0   Cholecalciferol (VITAMIN D3) 20 MCG (800 UNIT) TABS, Take 1 tablet by mouth daily., Disp: 30 tablet, Rfl: 3   Ferrous Gluconate 239 (27 Fe) MG TABS, Take 1 tablet by mouth daily., Disp: , Rfl:    fluticasone (FLONASE) 50 MCG/ACT nasal spray, Place 1-2 sprays into both nostrils daily., Disp: 16 g, Rfl: 0   levothyroxine (SYNTHROID) 50 MCG tablet, Take 50 mcg by mouth daily before breakfast., Disp: , Rfl:    vitamin C (ASCORBIC ACID) 250 MG tablet, Take 250 mg by mouth daily., Disp: , Rfl:    Medications ordered in this encounter:  Meds ordered this encounter  Medications   amoxicillin-clavulanate (AUGMENTIN) 875-125 MG tablet    Sig: Take 1 tablet by mouth 2 (two) times daily.    Dispense:  14 tablet    Refill:  0     *If you need refills on other medications prior to your next appointment, please contact your pharmacy*  Follow-Up: Call back or seek an in-person evaluation if the symptoms worsen or if the condition fails to improve as anticipated.  Other Instructions Use saline spray in your nose several times a day at different times in the Flonase spray.  Drink lots of liquids.  You might also try steamy showers or Vicks vapor rub to try to help drain your congestion.  Mucinex can also be helpful to get the congestion to drain.  If you do not feel significantly better in 2 days, I suggest you be seen for an  in person visit.   If you have been instructed to have an in-person evaluation today at a local Urgent Care facility, please use the link below. It will take you to a list of all of our available [facility], including address, phone number and hours of operation. Please do not delay care.  [facility]  If you or a family member do not have a primary care provider, use the link below to schedule a visit and establish care. When you choose a [facility] primary care physician or advanced practice provider, you gain a long-term partner in health. Find a Primary Care Provider  Learn more about [facility]'s in-office and virtual care options: [facility]

## 2021-03-13 NOTE — Progress Notes (Signed)
Virtual Visit Consent   Crystal Howell, you are scheduled for a virtual visit with a [facility] provider today.     Just as with appointments in the office, your consent must be obtained to participate.  Your consent will be active for this visit and any virtual visit you may have with one of our providers in the next 365 days.     If you have a MyChart account, a copy of this consent can be sent to you electronically.  All virtual visits are billed to your insurance company just like a traditional visit in the office.    As this is a virtual visit, video technology does not allow for your provider to perform a traditional examination.  This may limit your provider's ability to fully assess your condition.  If your provider identifies any concerns that need to be evaluated in person or the need to arrange testing (such as labs, EKG, etc.), we will make arrangements to do so.     Although advances in technology are sophisticated, we cannot ensure that it will always work on either your end or our end.  If the connection with a video visit is poor, the visit may have to be switched to a telephone visit.  With either a video or telephone visit, we are not always able to ensure that we have a secure connection.     I need to obtain your verbal consent now.   Are you willing to proceed with your visit today?    Crystal Howell has provided verbal consent on 03/13/2021 for a virtual visit (video or telephone).   Carvel Getting, NP   Date: 03/13/2021 5:45 PM   Virtual Visit via Video Note   I, Carvel Getting, connected with  Crystal Howell  ([id number], 1991-01-14) on 03/13/21 at  5:45 PM EST by a video-enabled telemedicine application and verified that I am speaking with the correct person using two identifiers.  Location: Patient: Virtual Visit Location Patient: Home Provider: Virtual Visit Location Provider: Home Office   I discussed the limitations of evaluation and management by  telemedicine and the availability of in person appointments. The patient expressed understanding and agreed to proceed.    History of Present Illness: Crystal Howell is a 30 y.o. who identifies as a female who was assigned female at birth, and is being seen today for patient reports nasal congestion and a cough since 03/04/2021.  She said she did not feel like it started as a cold that relief more for started like her allergies.  However, in the last 5 days symptoms have been worsening and are even worse today.  Patient reports increased sinus congestion, right earache and sore right submandibular lymph nodes, cough.  No sore throat or fever.  Reports the nasal drainage is thick yellow and occasionally bloody.  Reports facial pressure. Is using Flonase once a day and saline nasal spray once a day.  HPI: HPI  Problems:  Patient Active Problem List   Diagnosis Date Noted   Fatigue 12/20/2020   Anemia 12/20/2020   Obesity, Class I, BMI 30-34.9 12/20/2020   Hypothyroidism 12/20/2020   Depressive disorder 11/03/2020   Allergic rhinitis 12/12/2011    Allergies:  Allergies  Allergen Reactions   Doxycycline     Stomach pain   Erythromycin     Stomach pain   Nabumetone     Stomach upset   Medications:  Current Outpatient Medications:    amoxicillin-clavulanate (AUGMENTIN)  875-125 MG tablet, Take 1 tablet by mouth 2 (two) times daily., Disp: 14 tablet, Rfl: 0   Cholecalciferol (VITAMIN D3) 20 MCG (800 UNIT) TABS, Take 1 tablet by mouth daily., Disp: 30 tablet, Rfl: 3   Ferrous Gluconate 239 (27 Fe) MG TABS, Take 1 tablet by mouth daily., Disp: , Rfl:    fluticasone (FLONASE) 50 MCG/ACT nasal spray, Place 1-2 sprays into both nostrils daily., Disp: 16 g, Rfl: 0   levothyroxine (SYNTHROID) 50 MCG tablet, Take 50 mcg by mouth daily before breakfast., Disp: , Rfl:    vitamin C (ASCORBIC ACID) 250 MG tablet, Take 250 mg by mouth daily., Disp: , Rfl:   Observations/Objective: Patient is  well-developed, well-nourished in no acute distress.  Resting comfortably  at home.  Head is normocephalic, atraumatic.  No labored breathing.  Speech is clear and coherent with logical content.  Patient is alert and oriented at baseline.    Assessment and Plan: 1. Acute bacterial sinusitis  Rx augmentin. Increase saline nasal spray use to several times a day. Seek in person eval if sx aren't markedly better in 2 days. Declines offer of work note  Follow Up Instructions: I discussed the assessment and treatment plan with the patient. The patient was provided an opportunity to ask questions and all were answered. The patient agreed with the plan and demonstrated an understanding of the instructions.  A copy of instructions were sent to the patient via MyChart unless otherwise noted below.    The patient was advised to call back or seek an in-person evaluation if the symptoms worsen or if the condition fails to improve as anticipated.  Time:  I spent 10 minutes with the patient via telehealth technology discussing the above problems/concerns.    Carvel Getting, NP

## 2021-03-24 ENCOUNTER — Telehealth: Payer: Self-pay | Admitting: Physician Assistant

## 2021-03-24 DIAGNOSIS — B379 Candidiasis, unspecified: Secondary | ICD-10-CM

## 2021-03-24 DIAGNOSIS — T3695XA Adverse effect of unspecified systemic antibiotic, initial encounter: Secondary | ICD-10-CM

## 2021-03-24 MED ORDER — TERCONAZOLE 0.8 % VA CREA: 1.0000 | TOPICAL_CREAM | Freq: Every day | VAGINAL | 0 refills | Status: DC

## 2021-03-24 MED ORDER — FLUCONAZOLE 150 MG PO TABS: 150.0000 mg | ORAL_TABLET | Freq: Once | ORAL | 0 refills | Status: AC

## 2021-03-24 NOTE — Patient Instructions (Signed)
Crystal Howell, thank you for joining Mar Daring, PA-C for today's virtual visit.  While this provider is not your primary care provider (PCP), if your PCP is located in our provider database this encounter information will be shared with them immediately following your visit.  Consent: (Patient) Crystal Howell provided verbal consent for this virtual visit at the beginning of the encounter.  Current Medications:  Current Outpatient Medications:    fluconazole (DIFLUCAN) 150 MG tablet, Take 1 tablet (150 mg total) by mouth once for 1 dose. May repeat in 72 hrs if needed, Disp: 2 tablet, Rfl: 0   terconazole (TERAZOL 3) 0.8 % vaginal cream, Place 1 applicator vaginally at bedtime., Disp: 20 g, Rfl: 0   amoxicillin-clavulanate (AUGMENTIN) 875-125 MG tablet, Take 1 tablet by mouth 2 (two) times daily., Disp: 14 tablet, Rfl: 0   Cholecalciferol (VITAMIN D3) 20 MCG (800 UNIT) TABS, Take 1 tablet by mouth daily., Disp: 30 tablet, Rfl: 3   Ferrous Gluconate 239 (27 Fe) MG TABS, Take 1 tablet by mouth daily., Disp: , Rfl:    fluticasone (FLONASE) 50 MCG/ACT nasal spray, Place 1-2 sprays into both nostrils daily., Disp: 16 g, Rfl: 0   levothyroxine (SYNTHROID) 50 MCG tablet, Take 50 mcg by mouth daily before breakfast., Disp: , Rfl:    vitamin C (ASCORBIC ACID) 250 MG tablet, Take 250 mg by mouth daily., Disp: , Rfl:    Medications ordered in this encounter:  Meds ordered this encounter  Medications   fluconazole (DIFLUCAN) 150 MG tablet    Sig: Take 1 tablet (150 mg total) by mouth once for 1 dose. May repeat in 72 hrs if needed    Dispense:  2 tablet    Refill:  0    Order Specific Question:   Supervising Provider    Answer:   Sabra Heck, BRIAN [[id number]]   terconazole (TERAZOL 3) 0.8 % vaginal cream    Sig: Place 1 applicator vaginally at bedtime.    Dispense:  20 g    Refill:  0    Order Specific Question:   Supervising Provider    Answer:   Sabra Heck, [employer]     *If you need  refills on other medications prior to your next appointment, please contact your pharmacy*  Follow-Up: Call back or seek an in-person evaluation if the symptoms worsen or if the condition fails to improve as anticipated.  Other Instructions Vaginal Yeast Infection, Adult Vaginal yeast infection is a condition that causes vaginal discharge as well as soreness, swelling, and redness (inflammation) of the vagina. This is a common condition. Some women get this infection frequently. What are the causes? This condition is caused by a change in the normal balance of the yeast (Candida) and normal bacteria that live in the vagina. This change causes an overgrowth of yeast, which causes the inflammation. What increases the risk? The condition is more likely to develop in women who: Take antibiotic medicines. Have diabetes. Take birth control pills. Are pregnant. Douche often. Have a weak body defense system (immune system). Have been taking steroid medicines for a long time. Frequently wear tight clothing. What are the signs or symptoms? Symptoms of this condition include: White, thick, creamy vaginal discharge. Swelling, itching, redness, and irritation of the vagina. The lips of the vagina (labia) may be affected as well. Pain or a burning feeling while urinating. Pain during sex. How is this diagnosed? This condition is diagnosed based on: Your medical history. A physical exam.  A pelvic exam. Your health care provider will examine a sample of your vaginal discharge under a microscope. Your health care provider may send this sample for testing to confirm the diagnosis. How is this treated? This condition is treated with medicine. Medicines may be over-the-counter or prescription. You may be told to use one or more of the following: Medicine that is taken by mouth (orally). Medicine that is applied as a cream (topically). Medicine that is inserted directly into the vagina  (suppository). Follow these instructions at home: Take or apply over-the-counter and prescription medicines only as told by your health care provider. Do not use tampons until your health care provider approves. Do not have sex until your infection has cleared. Sex can prolong or worsen your symptoms of infection. Ask your health care provider when it is safe to resume sexual activity. Keep all follow-up visits. This is important. How is this prevented?  Do not wear tight clothes, such as pantyhose or tight pants. Wear breathable cotton underwear. Do not use douches, perfumed soap, creams, or powders. Wipe from front to back after using the toilet. If you have diabetes, keep your blood sugar levels under control. Ask your health care provider for other ways to prevent yeast infections. Contact a health care provider if: You have a fever. Your symptoms go away and then return. Your symptoms do not get better with treatment. Your symptoms get worse. You have new symptoms. You develop blisters in or around your vagina. You have blood coming from your vagina and it is not your menstrual period. You develop pain in your abdomen. Summary Vaginal yeast infection is a condition that causes discharge as well as soreness, swelling, and redness (inflammation) of the vagina. This condition is treated with medicine. Medicines may be over-the-counter or prescription. Take or apply over-the-counter and prescription medicines only as told by your health care provider. Do not douche. Resume sexual activity or use of tampons as instructed by your health care provider. Contact a health care provider if your symptoms do not get better with treatment or your symptoms go away and then return. This information is not intended to replace advice given to you by your health care provider. Make sure you discuss any questions you have with your health care provider. Document Revised: 06/13/2020 Document Reviewed:  06/13/2020 Elsevier Patient Education  2022 [employer].    If you have been instructed to have an in-person evaluation today at a local Urgent Care facility, please use the link below. It will take you to a list of all of our available [facility], including address, phone number and hours of operation. Please do not delay care.  [facility]  If you or a family member do not have a primary care provider, use the link below to schedule a visit and establish care. When you choose a [facility] primary care physician or advanced practice provider, you gain a long-term partner in health. Find a Primary Care Provider  Learn more about [facility]'s in-office and virtual care options: [facility]

## 2021-03-24 NOTE — Progress Notes (Signed)
Virtual Visit Consent   Crystal Howell, you are scheduled for a virtual visit with a [facility] provider today.     Just as with appointments in the office, your consent must be obtained to participate.  Your consent will be active for this visit and any virtual visit you may have with one of our providers in the next 365 days.     If you have a MyChart account, a copy of this consent can be sent to you electronically.  All virtual visits are billed to your insurance company just like a traditional visit in the office.    As this is a virtual visit, video technology does not allow for your provider to perform a traditional examination.  This may limit your provider's ability to fully assess your condition.  If your provider identifies any concerns that need to be evaluated in person or the need to arrange testing (such as labs, EKG, etc.), we will make arrangements to do so.     Although advances in technology are sophisticated, we cannot ensure that it will always work on either your end or our end.  If the connection with a video visit is poor, the visit may have to be switched to a telephone visit.  With either a video or telephone visit, we are not always able to ensure that we have a secure connection.     I need to obtain your verbal consent now.   Are you willing to proceed with your visit today?    JODEE WAGENAAR has provided verbal consent on 03/24/2021 for a virtual visit (video or telephone).   Mar Daring, PA-C   Date: 03/24/2021 6:08 PM   Virtual Visit via Video Note   I, Mar Daring, connected with  Crystal Howell  ([id number], 29-Jun-1990) on 03/24/21 at  5:45 PM EST by a video-enabled telemedicine application and verified that I am speaking with the correct person using two identifiers.  Location: Patient: Virtual Visit Location Patient: Home Provider: Virtual Visit Location Provider: Home Office   I discussed the limitations of evaluation and  management by telemedicine and the availability of in person appointments. The patient expressed understanding and agreed to proceed.    History of Present Illness: Crystal Howell is a 29 y.o. who identifies as a female who was assigned female at birth, and is being seen today for vaginal itching.  HPI: Vaginal Itching The patient's primary symptoms include genital itching and vaginal discharge. The patient's pertinent negatives include no genital odor, genital rash or pelvic pain. Primary symptoms comment: vaginal pain. This is a new problem. The current episode started in the past 7 days (Was placed on antibiotic last week. Completed Monday. Noticed symptoms prior to finishing. Tried Monistat on Wednesday without relief). The problem occurs constantly. The problem has been unchanged. The pain is mild. Pertinent negatives include no abdominal pain, back pain, chills, dysuria, fever, frequency, rash or urgency. There has been no bleeding. She has tried antifungals (Monistat) for the symptoms. The treatment provided no relief.     Problems:  Patient Active Problem List   Diagnosis Date Noted   Fatigue 12/20/2020   Anemia 12/20/2020   Obesity, Class I, BMI 30-34.9 12/20/2020   Hypothyroidism 12/20/2020   Depressive disorder 11/03/2020   Allergic rhinitis 12/12/2011    Allergies:  Allergies  Allergen Reactions   Doxycycline     Stomach pain   Erythromycin     Stomach pain   Nabumetone  Stomach upset   Medications:  Current Outpatient Medications:    fluconazole (DIFLUCAN) 150 MG tablet, Take 1 tablet (150 mg total) by mouth once for 1 dose. May repeat in 72 hrs if needed, Disp: 2 tablet, Rfl: 0   terconazole (TERAZOL 3) 0.8 % vaginal cream, Place 1 applicator vaginally at bedtime., Disp: 20 g, Rfl: 0   amoxicillin-clavulanate (AUGMENTIN) 875-125 MG tablet, Take 1 tablet by mouth 2 (two) times daily., Disp: 14 tablet, Rfl: 0   Cholecalciferol (VITAMIN D3) 20 MCG (800 UNIT) TABS,  Take 1 tablet by mouth daily., Disp: 30 tablet, Rfl: 3   Ferrous Gluconate 239 (27 Fe) MG TABS, Take 1 tablet by mouth daily., Disp: , Rfl:    fluticasone (FLONASE) 50 MCG/ACT nasal spray, Place 1-2 sprays into both nostrils daily., Disp: 16 g, Rfl: 0   levothyroxine (SYNTHROID) 50 MCG tablet, Take 50 mcg by mouth daily before breakfast., Disp: , Rfl:    vitamin C (ASCORBIC ACID) 250 MG tablet, Take 250 mg by mouth daily., Disp: , Rfl:   Observations/Objective: Patient is well-developed, well-nourished in no acute distress.  Resting comfortably at home.  Head is normocephalic, atraumatic.  No labored breathing.  Speech is clear and coherent with logical content.  Patient is alert and oriented at baseline.    Assessment and Plan: 1. Antibiotic-induced yeast infection - fluconazole (DIFLUCAN) 150 MG tablet; Take 1 tablet (150 mg total) by mouth once for 1 dose. May repeat in 72 hrs if needed  Dispense: 2 tablet; Refill: 0 - terconazole (TERAZOL 3) 0.8 % vaginal cream; Place 1 applicator vaginally at bedtime.  Dispense: 20 g; Refill: 0  - Suspect yeast infection from antibiotics.  - Diflucan given - Terconazole cream provided for topical treatment for external irritation. Place about an inch of cream on tip of finger and apply to affected areas twice daily   Follow Up Instructions: I discussed the assessment and treatment plan with the patient. The patient was provided an opportunity to ask questions and all were answered. The patient agreed with the plan and demonstrated an understanding of the instructions.  A copy of instructions were sent to the patient via MyChart unless otherwise noted below.    The patient was advised to call back or seek an in-person evaluation if the symptoms worsen or if the condition fails to improve as anticipated.  Time:  I spent 10 minutes with the patient via telehealth technology discussing the above problems/concerns.    Mar Daring, PA-C

## 2021-03-31 ENCOUNTER — Other Ambulatory Visit: Payer: Self-pay

## 2021-03-31 ENCOUNTER — Encounter (HOSPITAL_COMMUNITY): Payer: Self-pay

## 2021-03-31 ENCOUNTER — Ambulatory Visit (HOSPITAL_COMMUNITY)
Admission: RE | Admit: 2021-03-31 | Discharge: 2021-03-31 | Disposition: A | Discharge status: Home / Self Care | Payer: PRIVATE HEALTH INSURANCE | Source: Ambulatory Visit | Attending: Urgent Care | Admitting: Urgent Care

## 2021-03-31 VITALS — BP 106/59 | HR 70 | Temp 98.7°F | Resp 18

## 2021-03-31 DIAGNOSIS — R040 Epistaxis: Secondary | ICD-10-CM

## 2021-03-31 DIAGNOSIS — S0300XA Dislocation of jaw, unspecified side, initial encounter: Secondary | ICD-10-CM | POA: Diagnosis not present

## 2021-03-31 MED ORDER — IBUPROFEN 800 MG PO TABS: ORAL_TABLET | ORAL | 0 refills | Status: DC

## 2021-03-31 MED ORDER — BACLOFEN 10 MG PO TABS: 10.0000 mg | ORAL_TABLET | Freq: Three times a day (TID) | ORAL | 0 refills | Status: AC

## 2021-03-31 NOTE — Discharge Instructions (Addendum)
Start taking ibuprofen and baclofen as prescribed for TMJ symptoms. Avoid chewing gum and excessively hard/ sticky foods. Soft foods preferred. May use a warm compress on the external ear.  Please STOP using flonase as this is making your nose bleed. You may purchase OTC rhinaris/ nasogel/ or any medication that states "for dry noses". Follow up with your PCP if symptoms persist.

## 2021-03-31 NOTE — ED Provider Notes (Signed)
[facility]    CSN: [id number] Arrival date & time: 03/31/21  1753      History   Chief Complaint Chief Complaint  Patient presents with   appt 6 - rt ear pain    HPI Crystal Howell is a 30 y.o. female.   Pleasant 30 year old female presents today with right ear pain.  She saw her PCP via video chat for this symptom initially the beginning of December.  She was placed on 1 week worth of Augmentin.  Patient states this did seem to help mildly with the symptoms, but several days ago it returned.  She states the pain is only on the right side.  She reports pain both in front of and behind the ear.  She also states it seems to go down into her neck and also into her temporal region of her head.  She denies bruxism or clenching of her jaw.  She denies any ear discharge or hearing loss.  She does have a history of allergies for which she has been taking Flonase.  She admits to some nasal congestion but admits that her nose has been bleeding frequently most recently.  She denies fever, neck pain, headache loss of taste or smell, or any additional URI symptoms.    Past Medical History:  Diagnosis Date   Allergy    Anemia    Anxiety 2020   Chiari malformation    Depression    Fibroids    Headache(784.0)    Hx of menorrhagia    Thyroid disease     Patient Active Problem List   Diagnosis Date Noted   Fatigue 12/20/2020   Anemia 12/20/2020   Obesity, Class I, BMI 30-34.9 12/20/2020   Hypothyroidism 12/20/2020   Depressive disorder 11/03/2020   Allergic rhinitis 12/12/2011    Past Surgical History:  Procedure Laterality Date   WISDOM TOOTH EXTRACTION      OB History     Gravida  0   Para      Term      Preterm      AB      Living         SAB      IAB      Ectopic      Multiple      Live Births               Home Medications    Prior to Admission medications   Medication Sig Start Date End Date Taking? Authorizing Provider   baclofen (LIORESAL) 10 MG tablet Take 1 tablet (10 mg total) by mouth 3 (three) times daily for 10 days. 03/31/21 04/10/21 Yes Rozanne Heumann L, PA  ibuprofen (ADVIL) 800 MG tablet Take one tab PO TID With meals, PRN jaw pain 03/31/21  Yes Shaquira Moroz L, PA  Cholecalciferol (VITAMIN D3) 20 MCG (800 UNIT) TABS Take 1 tablet by mouth daily. 02/13/21   de [place], Raymond J, MD  Ferrous Gluconate 239 (27 Fe) MG TABS Take 1 tablet by mouth daily.    [provider]  fluticasone (FLONASE) 50 MCG/ACT nasal spray Place 1-2 sprays into both nostrils daily. 12/22/20   Wieters, Hallie C, PA-C  levothyroxine (SYNTHROID) 50 MCG tablet Take 50 mcg by mouth daily before breakfast.    [provider]  terconazole (TERAZOL 3) 0.8 % vaginal cream Place 1 applicator vaginally at bedtime. 03/24/21   Mar Daring, PA-C  vitamin C (ASCORBIC ACID) 250 MG tablet Take 250  mg by mouth daily.    [provider]    Family History Family History  Problem Relation Age of Onset   Thyroid disease Mother    Anemia Mother    Heart disease Maternal Grandmother    Emphysema Maternal Grandmother    Stroke Maternal Grandmother    Mental illness Maternal Grandmother    Lung cancer Maternal Grandfather    Heart disease Maternal Grandfather    Liver disease Father    Cancer Paternal Aunt    Cancer Maternal Aunt     Social History Social History   Tobacco Use   Smoking status: Never   Smokeless tobacco: Never  Substance Use Topics   Alcohol use: No   Drug use: No     Allergies   Doxycycline, Erythromycin, and Nabumetone   Review of Systems Review of Systems  HENT:  Positive for congestion and ear pain.   All other systems reviewed and are negative.   Physical Exam Triage Vital Signs ED Triage Vitals  Enc Vitals Group     BP 03/31/21 1815 (!) 106/59     Pulse Rate 03/31/21 1815 70     Resp 03/31/21 1815 18     Temp 03/31/21 1815 98.7 F (37.1 C)     Temp Source  03/31/21 1815 Oral     SpO2 03/31/21 1815 99 %     Weight --      Height --      Head Circumference --      Peak Flow --      Pain Score 03/31/21 1816 5     Pain Loc --      Pain Edu? --      Excl. in [place]? --    No data found.  Updated Vital Signs BP (!) 106/59 (BP Location: Left Arm)    Pulse 70    Temp 98.7 F (37.1 C) (Oral)    Resp 18    LMP 03/31/2021    SpO2 99%   Visual Acuity Right Eye Distance:   Left Eye Distance:   Bilateral Distance:    Right Eye Near:   Left Eye Near:    Bilateral Near:     Physical Exam Vitals and nursing note reviewed.  Constitutional:      General: She is not in acute distress.    Appearance: Normal appearance. She is obese. She is not ill-appearing, toxic-appearing or diaphoretic.  HENT:     Head: Normocephalic and atraumatic.     Jaw: There is normal jaw occlusion. Trismus, tenderness (R) and pain on movement (R) present. No swelling.     Salivary Glands: Right salivary gland is not diffusely enlarged or tender. Left salivary gland is not diffusely enlarged or tender.     Right Ear: Hearing, tympanic membrane, ear canal and external ear normal. No drainage, swelling or tenderness. No middle ear effusion. There is no impacted cerumen. No foreign body. No mastoid tenderness. No hemotympanum. Tympanic membrane is not injected, scarred, perforated, erythematous, retracted or bulging. Tympanic membrane has normal mobility.     Left Ear: Hearing, tympanic membrane, ear canal and external ear normal. No drainage, swelling or tenderness.  No middle ear effusion. There is no impacted cerumen. No foreign body. No mastoid tenderness. No hemotympanum. Tympanic membrane is not injected, scarred, perforated, erythematous, retracted or bulging. Tympanic membrane has normal mobility.     Nose:     Right Nostril: Epistaxis (no active bleed, but evidence of recent bleed) present.  Left Nostril: Epistaxis (no active bleed, but evidence of recent bleed)  present.     Right Turbinates: Not enlarged or swollen.     Left Turbinates: Not enlarged or swollen.     Right Sinus: No maxillary sinus tenderness or frontal sinus tenderness.     Left Sinus: No maxillary sinus tenderness or frontal sinus tenderness.     Mouth/Throat:     Lips: Pink. No lesions.     Mouth: Mucous membranes are moist. No injury, lacerations or oral lesions.     Tongue: No lesions.     Palate: No mass and lesions.     Pharynx: Oropharynx is clear. No pharyngeal swelling, oropharyngeal exudate, posterior oropharyngeal erythema or uvula swelling.     Tonsils: No tonsillar exudate or tonsillar abscesses.  Neurological:     Mental Status: She is alert.     UC Treatments / Results  Labs (all labs ordered are listed, but only abnormal results are displayed) Labs Reviewed - No data to display  EKG   Radiology No results found.  Procedures Procedures (including critical care time)  Medications Ordered in UC Medications - No data to display  Initial Impression / Assessment and Plan / UC Course  I have reviewed the triage vital signs and the nursing notes.  Pertinent labs & imaging results that were available during my care of the patient were reviewed by me and considered in my medical decision making (see chart for details).     TMJ -this is likely cause of the otalgia.  Ear exam is benign.  Patient states her "allergy" to nabumetone is stomach discomfort.  She admits she can take ibuprofen just fine without any adverse reactions.  Therefore we will start a in her milligrams ibuprofen to take 3 times daily with food, also add baclofen 3 times daily until symptoms improve.  May use moist compress.  Avoid chewing gum or chewy foods. Epistaxis -likely secondary to excessive Flonase use.  Stop Flonase and use over-the-counter Rhinaris or nasal gel to help with humidification of dry nasal passages.  Final Clinical Impressions(s) / UC Diagnoses   Final diagnoses:   TMJ (dislocation of temporomandibular joint), initial encounter  Epistaxis     Discharge Instructions      Start taking naproxen and baclofen as prescribed for TMJ symptoms. Avoid chewing gum and excessively hard/ sticky foods. Soft foods preferred. May use a warm compress on the external ear.  Please STOP using flonase as this is making your nose bleed. You may purchase OTC rhinaris/ nasogel/ or any medication that states "for dry noses". Follow up with your PCP if symptoms persist.     ED Prescriptions     Medication Sig Dispense Auth. Provider   ibuprofen (ADVIL) 800 MG tablet Take one tab PO TID With meals, PRN jaw pain 21 tablet Azreal Stthomas L, PA   baclofen (LIORESAL) 10 MG tablet Take 1 tablet (10 mg total) by mouth 3 (three) times daily for 10 days. 30 tablet Luther Newhouse L, [place]      PDMP not reviewed this encounter.   Chaney Malling, [place] 03/31/21 1912

## 2021-03-31 NOTE — ED Triage Notes (Signed)
Pt c/o rt ear pain with feeling off balance x3 days.

## 2021-06-12 ENCOUNTER — Ambulatory Visit (HOSPITAL_COMMUNITY)
Admission: EM | Admit: 2021-06-12 | Discharge: 2021-06-12 | Disposition: A | Discharge status: Home / Self Care | Payer: PRIVATE HEALTH INSURANCE | Attending: Physician Assistant | Admitting: Physician Assistant

## 2021-06-12 ENCOUNTER — Ambulatory Visit (INDEPENDENT_AMBULATORY_CARE_PROVIDER_SITE_OTHER): Payer: PRIVATE HEALTH INSURANCE

## 2021-06-12 ENCOUNTER — Other Ambulatory Visit: Payer: Self-pay

## 2021-06-12 ENCOUNTER — Encounter (HOSPITAL_COMMUNITY): Payer: Self-pay

## 2021-06-12 DIAGNOSIS — S6991XA Unspecified injury of right wrist, hand and finger(s), initial encounter: Secondary | ICD-10-CM

## 2021-06-12 DIAGNOSIS — M79644 Pain in right finger(s): Secondary | ICD-10-CM | POA: Diagnosis not present

## 2021-06-12 DIAGNOSIS — Z23 Encounter for immunization: Secondary | ICD-10-CM

## 2021-06-12 MED ORDER — TETANUS-DIPHTH-ACELL PERTUSSIS 5-2.5-18.5 LF-MCG/0.5 IM SUSY
0.5000 mL | PREFILLED_SYRINGE | Freq: Once | INTRAMUSCULAR | Status: AC
  Administered 2021-06-12: 0.5 mL via INTRAMUSCULAR

## 2021-06-12 MED ORDER — TETANUS-DIPHTH-ACELL PERTUSSIS 5-2.5-18.5 LF-MCG/0.5 IM SUSY
PREFILLED_SYRINGE | INTRAMUSCULAR | Status: AC
  Filled 2021-06-12: qty 0.5

## 2021-06-12 MED ORDER — CEPHALEXIN 500 MG PO CAPS: 500.0000 mg | ORAL_CAPSULE | Freq: Three times a day (TID) | ORAL | 0 refills | Status: DC

## 2021-06-12 NOTE — Discharge Instructions (Signed)
Your x-ray was normal.  We updated your tetanus today.  Please start Keflex 500 mg 3 times daily to cover for infection.  Use warm compresses on this area.  Alternate Tylenol ibuprofen for pain.  If your symptoms not improving quickly with treatment plan please call hand specialist to schedule an appointment.  If you develop any worsening symptoms including redness or increased pain of your thumb, fever, nausea/vomiting you should go to the emergency room. ?

## 2021-06-12 NOTE — ED Triage Notes (Signed)
Pt reports knot on R thumb x 3 days.  ? ?Pt states it has gotten bigger.  ?

## 2021-06-12 NOTE — ED Provider Notes (Signed)
[facility]    CSN: [id number] Arrival date & time: 06/12/21  1524      History   Chief Complaint No chief complaint on file.   HPI Crystal Howell is a 31 y.o. female.   Patient presents today with a 3-day history of enlarging lesion on her right thumb.  Reports that she went to remove the trash can when she felt a sudden sharp pain.  She did not see any insect or sharp object but has had ongoing pain since that time.  Pain is rated 4 on a 0-10 pain scale, localized to interphalangeal joint of right thumb, described as aching, worse with palpation or flexion, no alleviating factors identified.  She has tried over-the-counter medication for symptom management.  She denies any numbness or paresthesias.  She is right-handed.  She denies any systemic symptoms including fever, nausea, vomiting, body aches.  She is unsure when her last tetanus was.   Past Medical History:  Diagnosis Date   Allergy    Anemia    Anxiety 2020   Chiari malformation    Depression    Fibroids    Headache(784.0)    Hx of menorrhagia    Thyroid disease     Patient Active Problem List   Diagnosis Date Noted   Fatigue 12/20/2020   Anemia 12/20/2020   Obesity, Class I, BMI 30-34.9 12/20/2020   Hypothyroidism 12/20/2020   Depressive disorder 11/03/2020   Allergic rhinitis 12/12/2011    Past Surgical History:  Procedure Laterality Date   WISDOM TOOTH EXTRACTION      OB History     Gravida  0   Para      Term      Preterm      AB      Living         SAB      IAB      Ectopic      Multiple      Live Births               Home Medications    Prior to Admission medications   Medication Sig Start Date End Date Taking? Authorizing Provider  cephALEXin (KEFLEX) 500 MG capsule Take 1 capsule (500 mg total) by mouth 3 (three) times daily. 06/12/21  Yes Jashad Depaula, Derry Skill, PA-C  Cholecalciferol (VITAMIN D3) 20 MCG (800 UNIT) TABS Take 1 tablet by mouth daily. 02/13/21   de  [place], Raymond J, MD  Ferrous Gluconate 239 (27 Fe) MG TABS Take 1 tablet by mouth daily.    [provider]  fluticasone (FLONASE) 50 MCG/ACT nasal spray Place 1-2 sprays into both nostrils daily. 12/22/20   Wieters, Hallie C, PA-C  ibuprofen (ADVIL) 800 MG tablet Take one tab PO TID With meals, PRN jaw pain 03/31/21   Crain, Whitney L, PA  levothyroxine (SYNTHROID) 50 MCG tablet Take 50 mcg by mouth daily before breakfast.    [provider]  terconazole (TERAZOL 3) 0.8 % vaginal cream Place 1 applicator vaginally at bedtime. 03/24/21   Mar Daring, PA-C  vitamin C (ASCORBIC ACID) 250 MG tablet Take 250 mg by mouth daily.    [provider]    Family History Family History  Problem Relation Age of Onset   Thyroid disease Mother    Anemia Mother    Heart disease Maternal Grandmother    Emphysema Maternal Grandmother    Stroke Maternal Grandmother    Mental illness Maternal Grandmother  Lung cancer Maternal Grandfather    Heart disease Maternal Grandfather    Liver disease Father    Cancer Paternal Aunt    Cancer Maternal Aunt     Social History Social History   Tobacco Use   Smoking status: Never   Smokeless tobacco: Never  Substance Use Topics   Alcohol use: No   Drug use: No     Allergies   Doxycycline, Erythromycin, and Nabumetone   Review of Systems Review of Systems  Constitutional:  Positive for activity change. Negative for appetite change, fatigue and fever.  Respiratory:  Negative for cough and shortness of breath.   Cardiovascular:  Negative for chest pain.  Gastrointestinal:  Negative for abdominal pain, diarrhea, nausea and vomiting.  Musculoskeletal:  Positive for arthralgias. Negative for myalgias.  Skin:  Negative for color change and wound.  Neurological:  Negative for dizziness, weakness, light-headedness, numbness and headaches.    Physical Exam Triage Vital Signs ED Triage Vitals  Enc Vitals Group     BP  06/12/21 1653 132/84     Pulse Rate 06/12/21 1649 (!) 7     Resp 06/12/21 1653 20     Temp --      Temp Source 06/12/21 1649 Oral     SpO2 06/12/21 1653 100 %     Weight --      Height --      Head Circumference --      Peak Flow --      Pain Score 06/12/21 1651 4     Pain Loc --      Pain Edu? --      Excl. in [place]? --    No data found.  Updated Vital Signs BP 132/84 (BP Location: Left Arm)    Pulse 85    Resp 20    LMP 05/23/2021 (Exact Date)    SpO2 100%   Visual Acuity Right Eye Distance:   Left Eye Distance:   Bilateral Distance:    Right Eye Near:   Left Eye Near:    Bilateral Near:     Physical Exam Vitals reviewed.  Constitutional:      General: She is awake. She is not in acute distress.    Appearance: Normal appearance. She is well-developed. She is not ill-appearing.     Comments: Very pleasant female appears stated age no acute distress sitting comfortably in exam room  HENT:     Head: Normocephalic and atraumatic.  Cardiovascular:     Rate and Rhythm: Normal rate and regular rhythm.     Heart sounds: Normal heart sounds, S1 normal and S2 normal. No murmur heard. Pulmonary:     Effort: Pulmonary effort is normal.     Breath sounds: Normal breath sounds. No wheezing, rhonchi or rales.     Comments: Clear to auscultation bilaterally Musculoskeletal:     Right hand: Tenderness present. No swelling or bony tenderness. Normal range of motion. Normal strength. There is no disruption of two-point discrimination. Normal capillary refill.     Comments: Right hand: 0.5 cm nodule noted proximal to right thumb interphalangeal joint without erythema or swelling.  No bleeding or drainage noted.  No streaking or evidence of lymphangitis.  Normal active range of motion at thumb.  Area is tender to palpation without deformity.  Hand neurovascularly intact.  Psychiatric:        Behavior: Behavior is cooperative.     UC Treatments / Results  Labs (all labs ordered are  listed,  but only abnormal results are displayed) Labs Reviewed - No data to display  EKG   Radiology DG Finger Thumb Right  Result Date: 06/12/2021 CLINICAL DATA:  Pain after injury. EXAM: RIGHT THUMB 2+V COMPARISON:  None. FINDINGS: There is no evidence of fracture or dislocation. There is no evidence of arthropathy or other focal bone abnormality. Soft tissues are unremarkable. IMPRESSION: Negative. Electronically Signed   By: Ronney Asters M.D.   On: 06/12/2021 17:32    Procedures Procedures (including critical care time)  Medications Ordered in UC Medications  Tdap (BOOSTRIX) injection 0.5 mL (0.5 mLs Intramuscular Given 06/12/21 1738)    Initial Impression / Assessment and Plan / UC Course  I have reviewed the triage vital signs and the nursing notes.  Pertinent labs & imaging results that were available during my care of the patient were reviewed by me and considered in my medical decision making (see chart for details).     Unclear etiology of symptoms.  This appears to be a normal inclusion cyst but given this only recently became noticeable after injury concern for bacterial inoculation with puncture wound and will cover with Keflex.  X-ray was obtained that showed osseous abnormality.  Patient did have tetanus updated given puncture injury recently.  Recommended alternating Tylenol and ibuprofen for pain relief.  Discussed that if symptoms not improve quickly she should follow-up with hand specialist was given contact information for local provider with instruction to call to schedule an appointment with them.  Discussed alarm symptoms that warrant emergent evaluation including increased redness, streaking, fever, decreased range of motion, numbness, paresthesias, nausea/vomiting.  Strict return precautions given to which she expressed understanding.  Work excuse note provided.  Final Clinical Impressions(s) / UC Diagnoses   Final diagnoses:  Pain of right thumb  Injury of  right thumb, initial encounter     Discharge Instructions      Your x-ray was normal.  We updated your tetanus today.  Please start Keflex 500 mg 3 times daily to cover for infection.  Use warm compresses on this area.  Alternate Tylenol ibuprofen for pain.  If your symptoms not improving quickly with treatment plan please call hand specialist to schedule an appointment.  If you develop any worsening symptoms including redness or increased pain of your thumb, fever, nausea/vomiting you should go to the emergency room.     ED Prescriptions     Medication Sig Dispense Auth. Provider   cephALEXin (KEFLEX) 500 MG capsule Take 1 capsule (500 mg total) by mouth 3 (three) times daily. 21 capsule Jehieli Brassell K, PA-C      PDMP not reviewed this encounter.   Terrilee Croak, PA-C 06/12/21 1748

## 2021-06-15 ENCOUNTER — Ambulatory Visit (INDEPENDENT_AMBULATORY_CARE_PROVIDER_SITE_OTHER): Payer: PRIVATE HEALTH INSURANCE | Admitting: Orthopedic Surgery

## 2021-06-15 ENCOUNTER — Other Ambulatory Visit: Payer: Self-pay

## 2021-06-15 ENCOUNTER — Encounter: Payer: Self-pay | Admitting: Orthopedic Surgery

## 2021-06-15 DIAGNOSIS — M67441 Ganglion, right hand: Secondary | ICD-10-CM

## 2021-06-15 DIAGNOSIS — M67449 Ganglion, unspecified hand: Secondary | ICD-10-CM

## 2021-06-15 NOTE — Progress Notes (Signed)
? ?Office Visit Note ?  ?Patient: Crystal Howell           ?Date of Birth: 21-Feb-1991           ?MRN: [id number] ?Visit Date: 06/15/2021 ?             ?Requested by: Johna Roles, PA ?[address] ?[address] ?[place],  [place] [address] ?PCP: Johna Roles, PA ? ? ?Assessment & Plan: ?Visit Diagnoses:  ?1. Ganglion cyst of finger   ? ? ?Plan: We discussed the diagnosis, prognosis, non-operative and operative treatment options for ganglion or retinacular cysts. ? ?After our discussion, the patient would like to proceed with cyst aspiration.  We reviewed the risks and benefits of conservative management.  The patient expressed understanding of the reasoning and strategy going forward. ? ?All patient questions and concerns were addressed.  ? ? ?Follow-Up Instructions: No follow-ups on file.  ? ?Orders:  ?No orders of the defined types were placed in this encounter. ? ?No orders of the defined types were placed in this encounter. ? ? ? ? Procedures: ?Hand/UE Inj for (Thumb retinacular cyst) on 06/15/2021 1:12 PM ?Indications: therapeutic ?Details: 22 G needle ?Aspirate: (amount: <1 cc) clear ?Procedure, treatment alternatives, risks and benefits explained, specific risks discussed. Consent was given by the patient. Immediately prior to procedure a time out was called to verify the correct patient, procedure, equipment, support staff and site/side marked as required. Patient was prepped and draped in the usual sterile fashion.  ? ? ? ? ?Clinical Data: ?No additional findings. ? ? ?Subjective: ?Chief Complaint  ?Patient presents with  ? Right Thumb - Pain  ? ? ?Is a 31 year old right-hand-dominant female presents with a mass of the right thumb.  The mass was first noticed last Thursday.  It was noticed incidentally when she was picking up her trash can.  She denies any injury to this part of her finger.  She denies any cuts, scrapes, or other breaks in her skin.  She seen at the [facility] urgent care where there was  concern for infection and she was started on Keflex.  The mass is somewhat symptomatic when grabbing certain objects.  She occasionally has pain with typing.  She denies any other lumps or bumps elsewhere. ? ? ?Review of Systems ? ? ?Objective: ?Vital Signs: Ht '5\' 4"'$  (1.626 m)   Wt 196 lb (88.9 kg)   LMP 05/23/2021 (Exact Date)   BMI 33.64 kg/m?  ? ?Physical Exam ?Constitutional:   ?   Appearance: Normal appearance.  ?Cardiovascular:  ?   Rate and Rhythm: Normal rate.  ?   Pulses: Normal pulses.  ?Pulmonary:  ?   Effort: Pulmonary effort is normal.  ?Skin: ?   General: Skin is warm and dry.  ?   Capillary Refill: Capillary refill takes less than 2 seconds.  ?Neurological:  ?   Mental Status: She is alert.  ? ? ?Left Hand Exam  ? ?Tenderness  ?The patient is experiencing no tenderness.  ? ?Other  ?Erythema: absent ?Sensation: normal ?Pulse: present ? ?Comments:  Approx 1 x 1 cm mass at volar radial aspect of thumb around the IP joint.  Mass is firm, round, mobile and transilluminates.  ? ? ? ? ?Specialty Comments:  ?No specialty comments available. ? ?Imaging: ?No results found. ? ? ?PMFS History: ?Patient Active Problem List  ? Diagnosis Date Noted  ? Ganglion cyst of finger 06/15/2021  ? Fatigue 12/20/2020  ? Anemia 12/20/2020  ?  Obesity, Class I, BMI 30-34.9 12/20/2020  ? Hypothyroidism 12/20/2020  ? Depressive disorder 11/03/2020  ? Allergic rhinitis 12/12/2011  ? ?Past Medical History:  ?Diagnosis Date  ? Allergy   ? Anemia   ? Anxiety 2020  ? Chiari malformation   ? Depression   ? Fibroids   ? Headache(784.0)   ? Hx of menorrhagia   ? Thyroid disease   ?  ?Family History  ?Problem Relation Age of Onset  ? Thyroid disease Mother   ? Anemia Mother   ? Heart disease Maternal Grandmother   ? Emphysema Maternal Grandmother   ? Stroke Maternal Grandmother   ? Mental illness Maternal Grandmother   ? Lung cancer Maternal Grandfather   ? Heart disease Maternal Grandfather   ? Liver disease Father   ? Cancer Paternal  Aunt   ? Cancer Maternal Aunt   ?  ?Past Surgical History:  ?Procedure Laterality Date  ? WISDOM TOOTH EXTRACTION    ? ?Social History  ? ?Occupational History  ?  Employer: [employer]  ?  Comment: [employer]  ?Tobacco Use  ? Smoking status: Never  ? Smokeless tobacco: Never  ?Substance and Sexual Activity  ? Alcohol use: No  ? Drug use: No  ? Sexual activity: Yes  ?  Birth control/protection: None  ? ? ? ? ? ? ?

## 2021-08-04 ENCOUNTER — Ambulatory Visit (HOSPITAL_COMMUNITY)
Admission: EM | Admit: 2021-08-04 | Discharge: 2021-08-04 | Disposition: A | Discharge status: Home / Self Care | Payer: PRIVATE HEALTH INSURANCE | Attending: Internal Medicine | Admitting: Internal Medicine

## 2021-08-04 ENCOUNTER — Encounter (HOSPITAL_COMMUNITY): Payer: Self-pay

## 2021-08-04 DIAGNOSIS — R079 Chest pain, unspecified: Secondary | ICD-10-CM | POA: Diagnosis not present

## 2021-08-04 LAB — POCT URINALYSIS DIPSTICK, ED / UC
Bilirubin Urine: NEGATIVE
Glucose, UA: NEGATIVE mg/dL
Hgb urine dipstick: NEGATIVE
Ketones, ur: NEGATIVE mg/dL
Nitrite: NEGATIVE
Protein, ur: NEGATIVE mg/dL
Specific Gravity, Urine: 1.025 (ref 1.005–1.030)
Urobilinogen, UA: 0.2 mg/dL (ref 0.0–1.0)
pH: 5.5 (ref 5.0–8.0)

## 2021-08-04 MED ORDER — IBUPROFEN 800 MG PO TABS
ORAL_TABLET | ORAL | Status: AC
  Filled 2021-08-04: qty 1

## 2021-08-04 MED ORDER — IBUPROFEN 800 MG PO TABS
800.0000 mg | ORAL_TABLET | Freq: Once | ORAL | Status: AC
  Administered 2021-08-04: 800 mg via ORAL

## 2021-08-04 MED ORDER — METHOCARBAMOL 500 MG PO TABS: 500.0000 mg | ORAL_TABLET | Freq: Two times a day (BID) | ORAL | 0 refills | Status: DC

## 2021-08-04 MED ORDER — NAPROXEN 500 MG PO TABS: 500.0000 mg | ORAL_TABLET | Freq: Two times a day (BID) | ORAL | 0 refills | Status: DC

## 2021-08-04 NOTE — ED Provider Notes (Signed)
?[place] ? ? ? ?CSN: [id number] ?Arrival date & time: 08/04/21  1823 ? ? ?  ? ?History   ?Chief Complaint ?Chief Complaint  ?Patient presents with  ? Chest Pain  ? ? ?HPI ?Crystal Howell is a 31 y.o. female.  ? ?Patient presents to urgent care for evaluation of chest pain and left arm pain that started yesterday afternoon after she ate lunch at work. 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 Patient ate fried chicken for lunch, green beans, and lemonade. Nothing out of the ordinary for her to eat. Denies ever having this pain before. Rates chest pain 8 on a scale of 0-10 at it's worst, currently it is a 6/10 and her left arm hurts worse than her chest. Describes chest pain as "bulging, tight, and tingling". Denies nausea, dizziness, diaphoresis, and headache associated with chest pain. Denies recent change in exercise. States her mom has a history of heart problems that she thinks is an arrhythmia and her maternal grandfather had a heart attack in his 54s. She has a very stressful job working for Therapist, art, but does not report any significant life events or changes in mental health to cause her anxiety at this time. She states that sometimes sleeping and being off work helps the pain. She states that when people are mean to her at work over the phone and she gets busy/can't take a break, the chest pain is worse. This is the first time she has had chest pain, but she gets headaches in the back of her head a lot from the stress. Denies any other aggravating or relieving factors. Has not taken any medication for this. Does not use tobacco or any illicit substances. Does not drink alcohol. Denies injury/trauma to chest wall recently.  ? ? ?Chest Pain ? ?Past Medical History:  ?Diagnosis Date  ? Allergy   ? Anemia   ? Anxiety 2020  ? Chiari malformation   ? Depression   ? Fibroids   ? Headache(784.0)   ? Hx of menorrhagia   ? Thyroid disease   ? ? ?Patient Active Problem  List  ? Diagnosis Date Noted  ? Ganglion cyst of finger 06/15/2021  ? Fatigue 12/20/2020  ? Anemia 12/20/2020  ? Obesity, Class I, BMI 30-34.9 12/20/2020  ? Hypothyroidism 12/20/2020  ? Depressive disorder 11/03/2020  ? Allergic rhinitis 12/12/2011  ? ? ?Past Surgical History:  ?Procedure Laterality Date  ? WISDOM TOOTH EXTRACTION    ? ? ?OB History   ? ? Gravida  ?0  ? Para  ?   ? Term  ?   ? Preterm  ?   ? AB  ?   ? Living  ?   ?  ? ? SAB  ?   ? IAB  ?   ? Ectopic  ?   ? Multiple  ?   ? Live Births  ?   ?   ?  ?  ? ? ? ?Home Medications   ? ?Prior to Admission medications   ?Medication Sig Start Date End Date Taking? Authorizing Provider  ?methocarbamol (ROBAXIN) 500 MG tablet Take 1 tablet (500 mg total) by mouth 2 (two) times daily. 08/04/21  Yes Talbot Grumbling, FNP  ?naproxen (NAPROSYN) 500 MG tablet Take 1 tablet (500 mg total) by mouth 2 (two) times daily. 08/04/21  Yes Talbot Grumbling, FNP  ?cephALEXin (KEFLEX) 500 MG capsule Take 1 capsule (500 mg total) by mouth 3 (three)  times daily. 06/12/21   Raspet, Derry Skill, PA-C  ?Cholecalciferol (VITAMIN D3) 20 MCG (800 UNIT) TABS Take 1 tablet by mouth daily. 02/13/21   de [place], Raymond J, MD  ?Ferrous Gluconate 239 (27 Fe) MG TABS Take 1 tablet by mouth daily.    [provider]  ?fluticasone (FLONASE) 50 MCG/ACT nasal spray Place 1-2 sprays into both nostrils daily. 12/22/20   Wieters, Hallie C, PA-C  ?levothyroxine (SYNTHROID) 50 MCG tablet Take 50 mcg by mouth daily before breakfast.    [provider]  ?terconazole (TERAZOL 3) 0.8 % vaginal cream Place 1 applicator vaginally at bedtime. 03/24/21   Mar Daring, PA-C  ?vitamin C (ASCORBIC ACID) 250 MG tablet Take 250 mg by mouth daily.    [provider]  ? ? ?Family History ?Family History  ?Problem Relation Age of Onset  ? Thyroid disease Mother   ? Anemia Mother   ? Heart disease Maternal Grandmother   ? Emphysema Maternal Grandmother   ? Stroke Maternal Grandmother    ? Mental illness Maternal Grandmother   ? Lung cancer Maternal Grandfather   ? Heart disease Maternal Grandfather   ? Liver disease Father   ? Cancer Paternal Aunt   ? Cancer Maternal Aunt   ? ? ?Social History ?Social History  ? ?Tobacco Use  ? Smoking status: Never  ? Smokeless tobacco: Never  ?Substance Use Topics  ? Alcohol use: No  ? Drug use: No  ? ? ? ?Allergies   ?Doxycycline, Erythromycin, Nabumetone, and Sertraline hcl ? ? ?Review of Systems ?Review of Systems  ?Cardiovascular:  Positive for chest pain.  ?Per HPI ? ?Physical Exam ?Triage Vital Signs ?ED Triage Vitals [08/04/21 1833]  ?Enc Vitals Group  ?   BP 112/76  ?   Pulse Rate 77  ?   Resp 18  ?   Temp 98.5 ?F (36.9 ?C)  ?   Temp Source Oral  ?   SpO2 99 %  ?   Weight   ?   Height   ?   Head Circumference   ?   Peak Flow   ?   Pain Score   ?   Pain Loc   ?   Pain Edu?   ?   Excl. in [place]?   ? ?No data found. ? ?Updated Vital Signs ?BP 112/76 (BP Location: Left Arm)   Pulse 77   Temp 98.5 ?F (36.9 ?C) (Oral)   Resp 18   SpO2 99%  ? ?Visual Acuity ?Right Eye Distance:   ?Left Eye Distance:   ?Bilateral Distance:   ? ?Right Eye Near:   ?Left Eye Near:    ?Bilateral Near:    ? ?Physical Exam ?Vitals and nursing note reviewed.  ?Constitutional:   ?   General: She is not in acute distress. ?   Appearance: Normal appearance. She is well-developed. She is not ill-appearing.  ?   Comments: Very pleasant patient sitting comfortably on exam table in no acute distress.  ?HENT:  ?   Head: Normocephalic and atraumatic.  ?   Right Ear: Tympanic membrane, ear canal and external ear normal.  ?   Left Ear: Tympanic membrane, ear canal and external ear normal.  ?   Nose: Nose normal.  ?   Mouth/Throat:  ?   Mouth: Mucous membranes are moist.  ?Eyes:  ?   Extraocular Movements: Extraocular movements intact.  ?   Conjunctiva/sclera: Conjunctivae normal.  ?Cardiovascular:  ?  Rate and Rhythm: Normal rate and regular rhythm.  ?   Heart sounds: Normal heart sounds, S1  normal and S2 normal. Heart sounds not distant. No murmur heard. ?  No friction rub. No gallop.  ?   Comments: Stable cardiopulmonary exam. ?Pulmonary:  ?   Effort: Pulmonary effort is normal. No respiratory distress.  ?   Breath sounds: Normal breath sounds. No wheezing, rhonchi or rales.  ?   Comments: Lung sounds clear to auscultation throughout.  Patient in no acute distress. ?Chest:  ?   Chest wall: No tenderness.  ?Abdominal:  ?   General: Bowel sounds are normal.  ?   Palpations: Abdomen is soft.  ?   Tenderness: There is abdominal tenderness in the left upper quadrant. There is left CVA tenderness. There is no right CVA tenderness.  ?   Comments: Minimally tender to the left upper quadrant.  ?Musculoskeletal:     ?   General: No swelling.  ?   Cervical back: Neck supple.  ?Skin: ?   General: Skin is warm and dry.  ?   Capillary Refill: Capillary refill takes less than 2 seconds.  ?   Findings: No rash.  ?Neurological:  ?   General: No focal deficit present.  ?   Mental Status: She is alert and oriented to person, place, and time.  ?   Motor: No weakness.  ?Psychiatric:     ?   Mood and Affect: Mood normal.     ?   Behavior: Behavior normal. Behavior is cooperative.     ?   Thought Content: Thought content normal.     ?   Judgment: Judgment normal.  ? ? ? ?UC Treatments / Results  ?Labs ?(all labs ordered are listed, but only abnormal results are displayed) ?Labs Reviewed  ?POCT URINALYSIS DIPSTICK, ED / UC - Abnormal; Notable for the following components:  ?    Result Value  ? Leukocytes,Ua SMALL (*)   ? All other components within normal limits  ? ? ?EKG ? ? ?Radiology ?No results found. ? ?Procedures ?Procedures (including critical care time) ? ?Medications Ordered in UC ?Medications  ?ibuprofen (ADVIL) tablet 800 mg (800 mg Oral Given 08/04/21 2003)  ? ? ?Initial Impression / Assessment and Plan / UC Course  ?I have reviewed the triage vital signs and the nursing notes. ? ?Pertinent labs & imaging results  that were available during my care of the patient were reviewed by me and considered in my medical decision making (see chart for details). ? ?Patient is a 31 year old female who presents to urgent care wit

## 2021-08-04 NOTE — Discharge Instructions (Addendum)
Your work-up today urgent care is reassuring.  Your chest pain is likely musculoskeletal in nature.  As discussed, please take naproxen twice daily for the next 5 days with food.  Please also take 1/2 tablet to 1 whole tablet of Robaxin muscle relaxer at nighttime.  Please do not take this medication with alcohol and do not drive while taking this medication.  Please do take these medications with food to avoid GI upset. ? ?We tested your urine today for a urinary tract infection.  It was negative. ? ?I gave you some ibuprofen while in the clinic today.  Your next dose of naproxen will be tomorrow morning for this reason.  While you take naproxen, please do not take any other NSAID medications such as aspirin, ibuprofen, Motrin, Advil, Aleve, and Goody powder. ? ?Perform range of motion exercises and apply heat as discussed in clinic.  If your symptoms do not improve or if you develop any new or worsening symptoms, please return to urgent care.  If your symptoms are severe, please go to the emergency room for evaluation.  Please follow-up with your primary care provider for further evaluation and management of your symptoms and for ongoing wellness visits. ? ?

## 2021-08-04 NOTE — ED Triage Notes (Signed)
Pt reports left arm pain and chest pain that started this morning.  ?Pt reports some anxiety and has been more stress than usual. ?

## 2021-08-10 ENCOUNTER — Ambulatory Visit
Admission: RE | Admit: 2021-08-10 | Discharge: 2021-08-10 | Disposition: A | Discharge status: Home / Self Care | Payer: PRIVATE HEALTH INSURANCE | Source: Ambulatory Visit | Attending: Emergency Medicine | Admitting: Emergency Medicine

## 2021-08-10 VITALS — BP 114/74 | HR 70 | Temp 98.5°F | Resp 18

## 2021-08-10 DIAGNOSIS — F418 Other specified anxiety disorders: Secondary | ICD-10-CM

## 2021-08-10 DIAGNOSIS — Z9189 Other specified personal risk factors, not elsewhere classified: Secondary | ICD-10-CM | POA: Diagnosis not present

## 2021-08-10 DIAGNOSIS — R079 Chest pain, unspecified: Secondary | ICD-10-CM | POA: Diagnosis not present

## 2021-08-10 MED ORDER — HYDROXYZINE HCL 25 MG PO TABS: 25.0000 mg | ORAL_TABLET | Freq: Four times a day (QID) | ORAL | 0 refills | Status: AC | PRN

## 2021-08-10 NOTE — ED Triage Notes (Addendum)
Pt c/o chest pain that began last Thursday, the pain is located in centralized, the pain radiates to left arm. Patient describes the pain as a sharp feeling. The patient reports the associated symptoms are SOB, and weakness, and the pain lasts for 5 minutes. Pt states the pain has not improved with the medication previously prescribed. Pt reports her job is very stressful.  ?

## 2021-08-10 NOTE — Discharge Instructions (Signed)
I am happy to reassure you that the pain that you are experiencing in your chest and left arm, when you are feeling stressed and under pressure, is not related to any dysfunction in your heart.  Your EKG today is completely normal, textbook normal.  I have provided you with a copy for your records. ? ?Other common causes of chest pain can include heartburn, muscle tension, anxiety and lack of sleep.  Not having a normally functioning thyroid can make you feel tired and make you more susceptible to pain in various parts of your body which would also include your chest occasionally. ? ?If you would like to experiment with heartburn medications to see if this resolves your chest pain, I recommend using Pepcid AC, a chewable tablet that contains 2 ingredients called Tums and famotidine.  These are available over-the-counter.  I provided you with a picture of this product.  You could also continue taking methocarbamol at bedtime for the pain you are experiencing in your left arm and shoulder. ? ?I have also provided you with a prescription for an antianxiety medication that you can take up to 4 times daily as needed for times when you are feeling overly anxious and overly stressed.  It is called hydroxyzine.  Please try this medication on a day that you are not working to determine whether or not it makes you sleepy; some patients will me that it makes them too sleepy for regular daytime use. ? ?Please follow-up with your primary care provider to discuss other possible treatments for anxiety such as medication, counseling, relaxation techniques, and yoga, among many others.  Anxiety is prevalent in our society, especially in this strange, new post COVID era where people are working harder and longer and feeling like they are not reaching their goals.  If you you feel you may be suffering from anxiety, I absolutely guarantee you that you are not alone. ? ?Thank you for visiting urgent care today.  I hope you feel better  soon.  I appreciate the opportunity to participate your care. ?

## 2021-08-10 NOTE — ED Provider Notes (Signed)
?[facility] ? ? ? ?CSN: [id number] ?Arrival date & time: 08/10/21  0906 ?  ? ?HISTORY  ? ?Chief Complaint  ?Patient presents with  ? Fatigue  ?  Chest pain - Entered by patient  ? Chest Pain  ? ?HPI ?Crystal Howell is a 31 y.o. female. Pt c/o chest pain that began last Thursday, the pain is located in centralized, the pain radiates to left arm. Patient describes the pain as a sharp feeling that radiates to her arm, usually resolves without intervention. The patient reports the associated symptoms are SOB, and weakness, and the pain lasts for 5 minutes, states the pain in her arm last longer. Pt states the pain has not improved with the medication previously prescribed, patient was seen on April 28 at urgent care where she was prescribed naproxen and a muscle relaxer.  Patient denies a history of GERD. Pt reports her job is very stressful.  EKG today was normal. ? ?The history is provided by the patient.  ?Past Medical History:  ?Diagnosis Date  ? Allergy   ? Anemia   ? Anxiety 2020  ? Chiari malformation   ? Depression   ? Fibroids   ? Headache(784.0)   ? Hx of menorrhagia   ? Thyroid disease   ? ?Patient Active Problem List  ? Diagnosis Date Noted  ? Ganglion cyst of finger 06/15/2021  ? Fatigue 12/20/2020  ? Anemia 12/20/2020  ? Obesity, Class I, BMI 30-34.9 12/20/2020  ? Hypothyroidism 12/20/2020  ? Depressive disorder 11/03/2020  ? Allergic rhinitis 12/12/2011  ? ?Past Surgical History:  ?Procedure Laterality Date  ? WISDOM TOOTH EXTRACTION    ? ?OB History   ? ? Gravida  ?0  ? Para  ?   ? Term  ?   ? Preterm  ?   ? AB  ?   ? Living  ?   ?  ? ? SAB  ?   ? IAB  ?   ? Ectopic  ?   ? Multiple  ?   ? Live Births  ?   ?   ?  ?  ? ?Home Medications   ? ?Prior to Admission medications   ?Medication Sig Start Date End Date Taking? Authorizing Provider  ?hydrOXYzine (ATARAX) 25 MG tablet Take 1 tablet (25 mg total) by mouth every 6 (six) hours as needed for anxiety. 08/10/21 09/09/21 Yes Lynden Oxford Scales,  PA-C  ?levothyroxine (SYNTHROID) 50 MCG tablet Take 50 mcg by mouth daily before breakfast.    [provider]  ? ? ?Family History ?Family History  ?Problem Relation Age of Onset  ? Thyroid disease Mother   ? Anemia Mother   ? Heart disease Maternal Grandmother   ? Emphysema Maternal Grandmother   ? Stroke Maternal Grandmother   ? Mental illness Maternal Grandmother   ? Lung cancer Maternal Grandfather   ? Heart disease Maternal Grandfather   ? Liver disease Father   ? Cancer Paternal Aunt   ? Cancer Maternal Aunt   ? ?Social History ?Social History  ? ?Tobacco Use  ? Smoking status: Never  ? Smokeless tobacco: Never  ?Substance Use Topics  ? Alcohol use: No  ? Drug use: No  ? ?Allergies   ?Doxycycline, Erythromycin, Nabumetone, and Sertraline hcl ? ?Review of Systems ?Review of Systems ?Pertinent findings noted in history of present illness.  ? ?Physical Exam ?Triage Vital Signs ?ED Triage Vitals  ?Enc Vitals Group  ?   BP 02/03/21  0827 (!) 147/82  ?   Pulse Rate 02/03/21 0827 72  ?   Resp 02/03/21 0827 18  ?   Temp 02/03/21 0827 98.3 ?F (36.8 ?C)  ?   Temp Source 02/03/21 0827 Oral  ?   SpO2 02/03/21 0827 98 %  ?   Weight --   ?   Height --   ?   Head Circumference --   ?   Peak Flow --   ?   Pain Score 02/03/21 0826 5  ?   Pain Loc --   ?   Pain Edu? --   ?   Excl. in [place]? --   ?No data found. ? ?Updated Vital Signs ?BP 114/74 (BP Location: Left Arm)   Pulse 70   Temp 98.5 ?F (36.9 ?C) (Oral)   Resp 18   LMP 08/10/2021   SpO2 98%  ? ?Physical Exam ?Vitals and nursing note reviewed.  ?Constitutional:   ?   General: She is not in acute distress. ?   Appearance: Normal appearance. She is not ill-appearing.  ?HENT:  ?   Head: Normocephalic and atraumatic.  ?   Salivary Glands: Right salivary gland is not diffusely enlarged or tender. Left salivary gland is not diffusely enlarged or tender.  ?   Right Ear: Tympanic membrane, ear canal and external ear normal. No drainage. No middle ear effusion. There is  no impacted cerumen. Tympanic membrane is not erythematous or bulging.  ?   Left Ear: Tympanic membrane, ear canal and external ear normal. No drainage.  No middle ear effusion. There is no impacted cerumen. Tympanic membrane is not erythematous or bulging.  ?   Nose: Nose normal. No nasal deformity, septal deviation, mucosal edema, congestion or rhinorrhea.  ?   Right Turbinates: Not enlarged, swollen or pale.  ?   Left Turbinates: Not enlarged, swollen or pale.  ?   Right Sinus: No maxillary sinus tenderness or frontal sinus tenderness.  ?   Left Sinus: No maxillary sinus tenderness or frontal sinus tenderness.  ?   Mouth/Throat:  ?   Lips: Pink. No lesions.  ?   Mouth: Mucous membranes are moist. No oral lesions.  ?   Pharynx: Oropharynx is clear. Uvula midline. No posterior oropharyngeal erythema or uvula swelling.  ?   Tonsils: No tonsillar exudate. 0 on the right. 0 on the left.  ?Eyes:  ?   General: Lids are normal.     ?   Right eye: No discharge.     ?   Left eye: No discharge.  ?   Extraocular Movements: Extraocular movements intact.  ?   Conjunctiva/sclera: Conjunctivae normal.  ?   Right eye: Right conjunctiva is not injected.  ?   Left eye: Left conjunctiva is not injected.  ?Neck:  ?   Trachea: Trachea and phonation normal.  ?Cardiovascular:  ?   Rate and Rhythm: Normal rate and regular rhythm.  ?   Pulses: Normal pulses.  ?   Heart sounds: Normal heart sounds. No murmur heard. ?  No friction rub. No gallop.  ?Pulmonary:  ?   Effort: Pulmonary effort is normal. No accessory muscle usage, prolonged expiration or respiratory distress.  ?   Breath sounds: Normal breath sounds. No stridor, decreased air movement or transmitted upper airway sounds. No decreased breath sounds, wheezing, rhonchi or rales.  ?Chest:  ?   Chest wall: No tenderness.  ?Musculoskeletal:     ?   General: Normal range of  motion.  ?   Cervical back: Normal range of motion and neck supple. Normal range of motion.  ?Lymphadenopathy:  ?    Cervical: No cervical adenopathy.  ?Skin: ?   General: Skin is warm and dry.  ?   Findings: No erythema or rash.  ?Neurological:  ?   General: No focal deficit present.  ?   Mental Status: She is alert and oriented to person, place, and time.  ?Psychiatric:     ?   Mood and Affect: Mood normal.     ?   Behavior: Behavior normal.  ? ? ?Visual Acuity ?Right Eye Distance:   ?Left Eye Distance:   ?Bilateral Distance:   ? ?Right Eye Near:   ?Left Eye Near:    ?Bilateral Near:    ? ?[facility] Couse / Diagnostics / Procedures:  ?  ?EKG ? ?Radiology ?No results found. ? ?Procedures ?ED EKG ? ?Date/Time: 08/10/2021 1:36 PM ?Performed by: Lynden Oxford Scales, PA-C ?Authorized by: Lynden Oxford Scales, PA-C  ? ?ECG reviewed by ED Physician in the absence of a cardiologist: yes   ?Previous ECG:  ?  Previous ECG:  Compared to current ?Interpretation:  ?  Interpretation: normal   ?Rate:  ?  ECG rate assessment: normal   ?Rhythm:  ?  Rhythm: sinus rhythm   ?Ectopy:  ?  Ectopy: none   ?QRS:  ?  QRS axis:  Normal ?  QRS intervals:  Normal ?  QRS conduction: normal   ?ST segments:  ?  ST segments:  Normal ?T waves:  ?  T waves: normal   ?Q waves:  ?  Abnormal Q-waves: not present   (including critical care time) ? ?UC Diagnoses / Final Clinical Impressions(s)   ?I have reviewed the triage vital signs and the nursing notes. ? ?Pertinent labs & imaging results that were available during my care of the patient were reviewed by me and considered in my medical decision making (see chart for details).   ? ?Final diagnoses:  ?Chest pain, unspecified type  ?At increased risk for stress  ?Situational anxiety  ? ?Patient is well appearing on exam today, patient advised EKG is normal.  Patient advised I recommend she follow-up with her primary care provider to discuss anxiety causing physical manifestations.  Patient provided with hydroxyzine to try when she is feeling stressed or anxious.  Return precautions advised. ? ?ED Prescriptions   ? ?  Medication Sig Dispense Auth. Provider  ? hydrOXYzine (ATARAX) 25 MG tablet Take 1 tablet (25 mg total) by mouth every 6 (six) hours as needed for anxiety. 120 tablet Lynden Oxford Scales, PA-C  ? ?  ? ?

## 2021-08-11 ENCOUNTER — Other Ambulatory Visit: Payer: Self-pay | Admitting: Physician Assistant

## 2021-08-11 ENCOUNTER — Ambulatory Visit
Admission: RE | Admit: 2021-08-11 | Discharge: 2021-08-11 | Disposition: A | Discharge status: Home / Self Care | Payer: PRIVATE HEALTH INSURANCE | Source: Ambulatory Visit | Attending: Physician Assistant | Admitting: Physician Assistant

## 2021-08-11 DIAGNOSIS — R0789 Other chest pain: Secondary | ICD-10-CM

## 2021-08-11 DIAGNOSIS — M79602 Pain in left arm: Secondary | ICD-10-CM

## 2021-10-27 ENCOUNTER — Ambulatory Visit (HOSPITAL_COMMUNITY): Payer: PRIVATE HEALTH INSURANCE

## 2023-05-14 ENCOUNTER — Emergency Department (HOSPITAL_COMMUNITY)
Admission: EM | Admit: 2023-05-14 | Discharge: 2023-05-14 | Disposition: A | Discharge status: Home / Self Care | Payer: PRIVATE HEALTH INSURANCE | Attending: Emergency Medicine | Admitting: Emergency Medicine

## 2023-05-14 ENCOUNTER — Emergency Department (HOSPITAL_COMMUNITY): Payer: PRIVATE HEALTH INSURANCE

## 2023-05-14 ENCOUNTER — Encounter (HOSPITAL_COMMUNITY): Payer: Self-pay

## 2023-05-14 ENCOUNTER — Other Ambulatory Visit: Payer: Self-pay

## 2023-05-14 DIAGNOSIS — R1032 Left lower quadrant pain: Secondary | ICD-10-CM | POA: Diagnosis present

## 2023-05-14 DIAGNOSIS — R197 Diarrhea, unspecified: Secondary | ICD-10-CM | POA: Insufficient documentation

## 2023-05-14 DIAGNOSIS — Z20822 Contact with and (suspected) exposure to covid-19: Secondary | ICD-10-CM | POA: Diagnosis not present

## 2023-05-14 DIAGNOSIS — R110 Nausea: Secondary | ICD-10-CM | POA: Insufficient documentation

## 2023-05-14 DIAGNOSIS — N838 Other noninflammatory disorders of ovary, fallopian tube and broad ligament: Secondary | ICD-10-CM

## 2023-05-14 LAB — RESP PANEL BY RT-PCR (RSV, FLU A&B, COVID)  RVPGX2
Influenza A by PCR: NEGATIVE
Influenza B by PCR: NEGATIVE
Resp Syncytial Virus by PCR: NEGATIVE
SARS Coronavirus 2 by RT PCR: NEGATIVE

## 2023-05-14 LAB — URINALYSIS, ROUTINE W REFLEX MICROSCOPIC
Bilirubin Urine: NEGATIVE
Glucose, UA: NEGATIVE mg/dL
Hgb urine dipstick: NEGATIVE
Ketones, ur: NEGATIVE mg/dL
Leukocytes,Ua: NEGATIVE
Nitrite: NEGATIVE
Protein, ur: NEGATIVE mg/dL
Specific Gravity, Urine: 1.02 (ref 1.005–1.030)
pH: 5 (ref 5.0–8.0)

## 2023-05-14 LAB — LIPASE, BLOOD: Lipase: 39 U/L (ref 11–51)

## 2023-05-14 LAB — COMPREHENSIVE METABOLIC PANEL
ALT: 11 U/L (ref 0–44)
AST: 17 U/L (ref 15–41)
Albumin: 4.2 g/dL (ref 3.5–5.0)
Alkaline Phosphatase: 38 U/L (ref 38–126)
Anion gap: 9 (ref 5–15)
BUN: 11 mg/dL (ref 6–20)
CO2: 21 mmol/L — ABNORMAL LOW (ref 22–32)
Calcium: 9.2 mg/dL (ref 8.9–10.3)
Chloride: 106 mmol/L (ref 98–111)
Creatinine, Ser: 0.5 mg/dL (ref 0.44–1.00)
GFR, Estimated: >60 mL/min (ref >60)
Glucose, Bld: 90 mg/dL (ref 70–99)
Potassium: 3.7 mmol/L (ref 3.5–5.1)
Sodium: 136 mmol/L (ref 135–145)
Total Bilirubin: 0.6 mg/dL (ref 0.0–1.2)
Total Protein: 7.9 g/dL (ref 6.5–8.1)

## 2023-05-14 LAB — CBC
HCT: 38.6 % (ref 36.0–46.0)
Hemoglobin: 12.3 g/dL (ref 12.0–15.0)
MCH: 28.7 pg (ref 26.0–34.0)
MCHC: 31.9 g/dL (ref 30.0–36.0)
MCV: 90.2 fL (ref 80.0–100.0)
Platelets: 292 10*3/uL (ref 150–400)
RBC: 4.28 MIL/uL (ref 3.87–5.11)
RDW: 15 % (ref 11.5–15.5)
WBC: 3.8 10*3/uL — ABNORMAL LOW (ref 4.0–10.5)
nRBC: 0 % (ref 0.0–0.2)

## 2023-05-14 LAB — HCG, SERUM, QUALITATIVE: Preg, Serum: NEGATIVE

## 2023-05-14 MED ORDER — OXYCODONE-ACETAMINOPHEN 5-325 MG PO TABS: 1.0000 | ORAL_TABLET | Freq: Four times a day (QID) | ORAL | 0 refills | Status: DC | PRN

## 2023-05-14 MED ORDER — ONDANSETRON 4 MG PO TBDP
4.0000 mg | ORAL_TABLET | Freq: Once | ORAL | Status: DC | PRN
  Filled 2023-05-14: qty 1

## 2023-05-14 MED ORDER — MORPHINE SULFATE (PF) 4 MG/ML IV SOLN
4.0000 mg | Freq: Once | INTRAVENOUS | Status: AC
  Administered 2023-05-14: 4 mg via INTRAVENOUS
  Filled 2023-05-14: qty 1

## 2023-05-14 MED ORDER — IOHEXOL 300 MG/ML  SOLN
100.0000 mL | Freq: Once | INTRAMUSCULAR | Status: AC | PRN
  Administered 2023-05-14: 100 mL via INTRAVENOUS

## 2023-05-14 NOTE — ED Triage Notes (Signed)
Left sided abdominal pain with nausea and loose stools intermittently since Saturday. Denies vomiting. Hx of dicerticulitis, feels the same

## 2023-05-14 NOTE — ED Provider Triage Note (Signed)
 Emergency Medicine Provider Triage Evaluation Note  WILDA WETHERELL , a 33 y.o. female  was evaluated in triage.  Pt complains of left lower quadrant pain the past 4 days with associated nausea.  Has history of diverticulitis feels the same.  Review of Systems  Positive: Abdominal pain, nausea Negative: Vomiting  Physical Exam  BP 129/78 (BP Location: Left Arm)   Pulse 76   Temp 98.4 F (36.9 C) (Oral)   Resp 17   Ht 5' 4 (1.626 m)   Wt 98.9 kg   SpO2 100%   BMI 37.42 kg/m  Gen:   Awake, no distress   Resp:  Normal effort  MSK:   Moves extremities without difficulty  Other:  Tenderness to the left lower quadrant and epigastrium  Medical Decision Making  Medically screening exam initiated at 12:17 PM.  Appropriate orders placed.  BRIANA FARNER was informed that the remainder of the evaluation will be completed by another provider, this initial triage assessment does not replace that evaluation, and the importance of remaining in the ED until their evaluation is complete.   Waddell Sluder, PA-C 05/14/23 1218

## 2023-05-14 NOTE — ED Provider Notes (Signed)
 [place] EMERGENCY DEPARTMENT AT [facility] Provider Note   CSN: [id number] Arrival date & time: 05/14/23  1153     History  Chief Complaint  Patient presents with   Abdominal Pain    Crystal Howell is a 33 y.o. female.  33 year old female presents with several days of left lower quadrant abdominal pain.  History of diverticulitis in the past and feels this is similar.  Denies any fever or chills.  No black or bloody stools.  No vaginal bleeding or discharge.  Denies any urinary symptoms.  Pain characterized as sharp and worse with any movement.       Home Medications Prior to Admission medications   Medication Sig Start Date End Date Taking? Authorizing Provider  levothyroxine (SYNTHROID) 50 MCG tablet Take 50 mcg by mouth daily before breakfast.    [provider]      Allergies    Doxycycline, Erythromycin, Nabumetone , and Sertraline hcl    Review of Systems   Review of Systems  All other systems reviewed and are negative.   Physical Exam Updated Vital Signs BP (!) 123/98   Pulse 76   Temp 99.3 F (37.4 C) (Oral)   Resp 17   Ht 1.626 m (5' 4)   Wt 98.9 kg   SpO2 100%   BMI 37.42 kg/m  Physical Exam Vitals and nursing note reviewed.  Constitutional:      General: She is not in acute distress.    Appearance: Normal appearance. She is well-developed. She is not toxic-appearing.  HENT:     Head: Normocephalic and atraumatic.  Eyes:     General: Lids are normal.     Conjunctiva/sclera: Conjunctivae normal.     Pupils: Pupils are equal, round, and reactive to light.  Neck:     Thyroid : No thyroid  mass.     Trachea: No tracheal deviation.  Cardiovascular:     Rate and Rhythm: Normal rate and regular rhythm.     Heart sounds: Normal heart sounds. No murmur heard.    No gallop.  Pulmonary:     Effort: Pulmonary effort is normal. No respiratory distress.     Breath sounds: Normal breath sounds. No stridor. No decreased breath  sounds, wheezing, rhonchi or rales.  Abdominal:     General: There is no distension.     Palpations: Abdomen is soft.     Tenderness: There is abdominal tenderness in the left lower quadrant. There is guarding. There is no rebound.    Musculoskeletal:        General: No tenderness. Normal range of motion.     Cervical back: Normal range of motion and neck supple.  Skin:    General: Skin is warm and dry.     Findings: No abrasion or rash.  Neurological:     Mental Status: She is alert and oriented to person, place, and time. Mental status is at baseline.     GCS: GCS eye subscore is 4. GCS verbal subscore is 5. GCS motor subscore is 6.     Cranial Nerves: No cranial nerve deficit.     Sensory: No sensory deficit.     Motor: Motor function is intact.  Psychiatric:        Attention and Perception: Attention normal.        Speech: Speech normal.        Behavior: Behavior normal.     ED Results / Procedures / Treatments   Labs (all labs ordered are  listed, but only abnormal results are displayed) Labs Reviewed  COMPREHENSIVE METABOLIC PANEL - Abnormal; Notable for the following components:      Result Value   CO2 21 (*)    All other components within normal limits  CBC - Abnormal; Notable for the following components:   WBC 3.8 (*)    All other components within normal limits  LIPASE, BLOOD  URINALYSIS, ROUTINE W REFLEX MICROSCOPIC  HCG, SERUM, QUALITATIVE    EKG None  Radiology CT ABDOMEN PELVIS W CONTRAST Result Date: 05/14/2023 CLINICAL DATA:  LLQ abdominal pain EXAM: CT ABDOMEN AND PELVIS WITH CONTRAST TECHNIQUE: Multidetector CT imaging of the abdomen and pelvis was performed using the standard protocol following bolus administration of intravenous contrast. RADIATION DOSE REDUCTION: This exam was performed according to the departmental dose-optimization program which includes automated exposure control, adjustment of the mA and/or kV according to patient size and/or use  of iterative reconstruction technique. CONTRAST:  OMNIPAQUE  IOHEXOL  300 MG/ML  SOLN COMPARISON:  CT abdomen pelvis 03/18/2023 FINDINGS: Lower chest: No acute abnormality. Hepatobiliary: Stable 1.8 cm left hepatic lobe enhancing lesion that may represent a hepatic hemangioma. No gallstones, gallbladder wall thickening, or pericholecystic fluid. No biliary dilatation. Pancreas: No focal lesion. Normal pancreatic contour. No surrounding inflammatory changes. No main pancreatic ductal dilatation. Spleen: Normal in size without focal abnormality. Adrenals/Urinary Tract: No adrenal nodule bilaterally. Bilateral kidneys enhance symmetrically. No hydronephrosis. No hydroureter. The urinary bladder is unremarkable. Stomach/Bowel: Stomach is within normal limits. No evidence of bowel wall thickening or dilatation. Colonic diverticulosis. Appendix appears normal. Appendicolith noted within the tip of the appendix. Vascular/Lymphatic: No abdominal aorta or iliac aneurysm. No abdominal, pelvic, or inguinal lymphadenopathy. Reproductive: The uterus is markedly enlarged with multiple heterogeneous masses 1 of which is calcified. The uterus measures up to 16 x 13 x 20 cm. The largest contain uterine fibroids measures up to at least 8.5 cm an appears grossly stable in size compared to CT abdomen pelvis 03/18/2023. There is a left adnexal heterogeneous complex mass containing soft tissue, cystic, calcific, and fatty density. This measures up to 4 x 5.5 x 4.5 cm. The associated ovary appears enlarged in size (2:44) measuring up to 5.5 x 3.7 cm. There is another heterogeneous pelvic density posterior to the uterus and likely related to the right adnexa that measures 9 x 6.5 x 6.5 cm. This contains cystic, solid, calcific, fatty density. Other: No intraperitoneal free fluid. No intraperitoneal free gas. No organized fluid collection. Musculoskeletal: No abdominal wall hernia or abnormality. No suspicious lytic or blastic osseous  lesions. No acute displaced fracture. IMPRESSION: 1. Markedly enlarged fibroid uterus measuring up to 20 cm. 2. Interval enlarged left ovary in the setting of dermoid cyst/teratoma measuring up to 8 cm on the left and 9 cm on the right. Underlying ovarian torsion is not excluded. Given size of pelvic findings, a pelvic ultrasound may be limited. Recommend MRI with and without contrast of the mid to lower abdomen and pelvis for further evaluation. 3. Colonic diverticulosis with no acute diverticulitis. Electronically Signed   By: Morgane  Naveau M.D.   On: 05/14/2023 15:10    Procedures Procedures    Medications Ordered in ED Medications  ondansetron  (ZOFRAN -ODT) disintegrating tablet 4 mg (has no administration in time range)  iohexol  (OMNIPAQUE ) 300 MG/ML solution 100 mL (100 mLs Intravenous Contrast Given 05/14/23 1358)    ED Course/ Medical Decision Making/ A&P  Medical Decision Making Amount and/or Complexity of Data Reviewed Labs: ordered. Radiology: ordered.  Risk Prescription drug management.   Patient here with left lower quadrant Donnell pain that she was concerned was possible diverticulitis.  Abdominal CT showed a pelvic mass.  Follow-up pelvic ultrasound showed no evidence of torsion but does have a dermoid cyst.  She is comfortable at this time.  Despite the low-grade temperature here but she states she has no URI symptoms.  COVID and flu test sent.  Patient will follow-up with MyChart for the results.  Patient to call her PCP tomorrow for GYN referral        Final Clinical Impression(s) / ED Diagnoses Final diagnoses:  None    Rx / DC Orders ED Discharge Orders     None         Dasie Faden, MD 05/14/23 2120

## 2023-05-14 NOTE — Discharge Instructions (Signed)
 You have a 5 cm x 5 cm x 4.5 cm mass in your left ovary.  This is likely a dermoid cyst.  Call your primary care provider to get a referral to a gynecologist.  Use Tylenol  and or ibuprofen  as needed for pain.  Follow-up in MyChart for the results of your flu test

## 2023-05-28 ENCOUNTER — Telehealth: Payer: Self-pay

## 2023-05-28 NOTE — Telephone Encounter (Signed)
Called and left voicemail for patient to call back, received records from [facility], patient seen in January of 2025. Need to know the reason for transfer and if specific provider is requested.

## 2023-05-31 ENCOUNTER — Ambulatory Visit: Payer: PRIVATE HEALTH INSURANCE | Admitting: Obstetrics and Gynecology

## 2023-05-31 ENCOUNTER — Encounter: Payer: Self-pay | Admitting: Obstetrics and Gynecology

## 2023-05-31 VITALS — BP 104/62 | HR 97 | Temp 97.9°F | Ht 65.0 in | Wt 208.0 lb

## 2023-05-31 DIAGNOSIS — R102 Pelvic and perineal pain: Secondary | ICD-10-CM | POA: Diagnosis not present

## 2023-05-31 DIAGNOSIS — N83201 Unspecified ovarian cyst, right side: Secondary | ICD-10-CM

## 2023-05-31 DIAGNOSIS — D259 Leiomyoma of uterus, unspecified: Secondary | ICD-10-CM

## 2023-05-31 DIAGNOSIS — N939 Abnormal uterine and vaginal bleeding, unspecified: Secondary | ICD-10-CM

## 2023-05-31 DIAGNOSIS — N83202 Unspecified ovarian cyst, left side: Secondary | ICD-10-CM

## 2023-05-31 NOTE — Progress Notes (Signed)
 33 y.o. G0P0 female with uterine fibroids here for new patient consultation. Single.  Patient's last menstrual period was 05/23/2023 (approximate).   Patient was seen by Dr. Burman Foster, [facility] surgeon in [facility] on 05/25/2022.  Prescribe TXA with plans for an MRI. She has expelled the Mirena due to heavy vaginal bleeding. Tried OCPs and NSAIDs without improvement in bleeding.  Today, she presents for secondary consultation reporting ongoing HVB, bulk symptoms including left lower quadrant pain and occasional dyspareunia Desires fertility sparing treatments.  03/18/23 Hgb 12.3 03/14/2019 14 cm uterus with 7 cm exophytic posterior fibroid and 5 cm fundal fibroid. 05/14/2023 TVUS: 20 cm uterus, 5.2 cm and 4 cm heterogeneous mass of the left and right ovary, thought to be a fat-containing mass on CT imaging.  GYN HISTORY: No significant history  OB History  Gravida Para Term Preterm AB Living  0       SAB IAB Ectopic Multiple Live Births          Past Medical History:  Diagnosis Date   Allergy    Anemia    Anxiety 2020   Chiari malformation    Depression    Fibroids    Headache(784.0)    Hx of menorrhagia    Thyroid disease     Past Surgical History:  Procedure Laterality Date   WISDOM TOOTH EXTRACTION      Current Outpatient Medications on File Prior to Visit  Medication Sig Dispense Refill   cholecalciferol (VITAMIN D3) 25 MCG (1000 UNIT) tablet Take 1,000 Units by mouth daily.     levothyroxine (SYNTHROID) 50 MCG tablet Take 50 mcg by mouth daily before breakfast.     oxyCODONE-acetaminophen (PERCOCET/ROXICET) 5-325 MG tablet Take 1 tablet by mouth every 6 (six) hours as needed for severe pain (pain score 7-10). (Patient not taking: Reported on 05/31/2023) 15 tablet 0   No current facility-administered medications on file prior to visit.    Allergies  Allergen Reactions   Doxycycline     Stomach pain   Erythromycin     Stomach pain   Nabumetone      Stomach upset   Sertraline Hcl     Other reaction(s): Muscle twitches      PE Today's Vitals   05/31/23 0909  BP: 104/62  Pulse: 97  Temp: 97.9 F (36.6 C)  TempSrc: Oral  SpO2: 98%  Weight: 208 lb (94.3 kg)  Height: 5\' 5"  (1.651 m)   Body mass index is 34.61 kg/m.  Physical Exam Vitals reviewed.  Constitutional:      General: She is not in acute distress.    Appearance: Normal appearance.  HENT:     Head: Normocephalic and atraumatic.     Nose: Nose normal.  Eyes:     Extraocular Movements: Extraocular movements intact.     Conjunctiva/sclera: Conjunctivae normal.  Pulmonary:     Effort: Pulmonary effort is normal.  Musculoskeletal:        General: Normal range of motion.     Cervical back: Normal range of motion.  Neurological:     General: No focal deficit present.     Mental Status: She is alert.  Psychiatric:        Mood and Affect: Mood normal.        Behavior: Behavior normal.       Assessment and Plan:        Uterine leiomyoma, unspecified location Abnormal uterine bleeding (AUB) Pelvic pain Symptomatic fibroids with bulk sx and  AUB. Uterus also significantly enlarged since 2020. Imaging reviewed at detailed. Significantly enlarged uterus Discussed fertility sparing surgical management in the form of myomectomy and future obstetrical risk involved Also discussed [place] PAP, EMB, pelvic exam next appt Consider MRI All questions answered -     Endometrial biopsy; Future  Bilateral ovarian cysts  Likely bilateral dermoid cysts, reviewed thought to be benign however surgical removal is required to confirm this.  Rosalyn Gess, MD

## 2023-06-07 ENCOUNTER — Ambulatory Visit: Payer: PRIVATE HEALTH INSURANCE | Admitting: Obstetrics and Gynecology

## 2023-06-17 ENCOUNTER — Emergency Department (HOSPITAL_BASED_OUTPATIENT_CLINIC_OR_DEPARTMENT_OTHER)
Admission: EM | Admit: 2023-06-17 | Discharge: 2023-06-18 | Disposition: A | Discharge status: Home / Self Care | Payer: PRIVATE HEALTH INSURANCE | Attending: Emergency Medicine | Admitting: Emergency Medicine

## 2023-06-17 ENCOUNTER — Other Ambulatory Visit: Payer: Self-pay

## 2023-06-17 DIAGNOSIS — R531 Weakness: Secondary | ICD-10-CM | POA: Insufficient documentation

## 2023-06-17 DIAGNOSIS — E876 Hypokalemia: Secondary | ICD-10-CM | POA: Insufficient documentation

## 2023-06-17 DIAGNOSIS — M791 Myalgia, unspecified site: Secondary | ICD-10-CM | POA: Diagnosis not present

## 2023-06-17 DIAGNOSIS — M79604 Pain in right leg: Secondary | ICD-10-CM | POA: Insufficient documentation

## 2023-06-17 DIAGNOSIS — M79605 Pain in left leg: Secondary | ICD-10-CM | POA: Insufficient documentation

## 2023-06-17 DIAGNOSIS — R252 Cramp and spasm: Secondary | ICD-10-CM | POA: Diagnosis not present

## 2023-06-17 LAB — URINALYSIS, ROUTINE W REFLEX MICROSCOPIC
Bilirubin Urine: NEGATIVE
Glucose, UA: NEGATIVE mg/dL
Ketones, ur: NEGATIVE mg/dL
Leukocytes,Ua: NEGATIVE
Nitrite: NEGATIVE
Protein, ur: NEGATIVE mg/dL
Specific Gravity, Urine: 1.015 (ref 1.005–1.030)
pH: 6 (ref 5.0–8.0)

## 2023-06-17 LAB — URINALYSIS, MICROSCOPIC (REFLEX): WBC, UA: NONE SEEN WBC/hpf (ref 0–5)

## 2023-06-17 LAB — PREGNANCY, URINE: Preg Test, Ur: NEGATIVE

## 2023-06-17 NOTE — ED Triage Notes (Signed)
 Patient coming to ED for evaluation of "generalized weakness and leg pain."  States "this has happened before and it was from because my potassium was low."  Symptoms started on Friday.  Went to Urgent Care this evening and was instructed to come here for evaluation.  Pt does not currently take potassium supplements.  No reports of chest pain, fevers, nausea, or vomiting.

## 2023-06-18 ENCOUNTER — Emergency Department (HOSPITAL_BASED_OUTPATIENT_CLINIC_OR_DEPARTMENT_OTHER): Payer: PRIVATE HEALTH INSURANCE

## 2023-06-18 LAB — BASIC METABOLIC PANEL
Anion gap: 10 (ref 5–15)
BUN: 11 mg/dL (ref 6–20)
CO2: 19 mmol/L — ABNORMAL LOW (ref 22–32)
Calcium: 9.2 mg/dL (ref 8.9–10.3)
Chloride: 108 mmol/L (ref 98–111)
Creatinine, Ser: 0.65 mg/dL (ref 0.44–1.00)
GFR, Estimated: >60 mL/min (ref >60)
Glucose, Bld: 83 mg/dL (ref 70–99)
Potassium: 3.8 mmol/L (ref 3.5–5.1)
Sodium: 137 mmol/L (ref 135–145)

## 2023-06-18 LAB — CBC
HCT: 37.1 % (ref 36.0–46.0)
Hemoglobin: 12.1 g/dL (ref 12.0–15.0)
MCH: 28.7 pg (ref 26.0–34.0)
MCHC: 32.6 g/dL (ref 30.0–36.0)
MCV: 87.9 fL (ref 80.0–100.0)
Platelets: 221 10*3/uL (ref 150–400)
RBC: 4.22 MIL/uL (ref 3.87–5.11)
RDW: 14.2 % (ref 11.5–15.5)
WBC: 5.5 10*3/uL (ref 4.0–10.5)
nRBC: 0 % (ref 0.0–0.2)

## 2023-06-18 LAB — D-DIMER, QUANTITATIVE: D-Dimer, Quant: 0.65 ug{FEU}/mL — ABNORMAL HIGH (ref 0.00–0.50)

## 2023-06-18 LAB — MAGNESIUM: Magnesium: 2 mg/dL (ref 1.7–2.4)

## 2023-06-18 LAB — CK: Total CK: 98 U/L (ref 38–234)

## 2023-06-18 MED ORDER — POTASSIUM CHLORIDE CRYS ER 20 MEQ PO TBCR
40.0000 meq | EXTENDED_RELEASE_TABLET | Freq: Once | ORAL | Status: AC
  Administered 2023-06-18: 40 meq via ORAL
  Filled 2023-06-18: qty 2

## 2023-06-18 MED ORDER — KETOROLAC TROMETHAMINE 30 MG/ML IJ SOLN
15.0000 mg | Freq: Once | INTRAMUSCULAR | Status: AC
  Administered 2023-06-18: 15 mg via INTRAMUSCULAR
  Filled 2023-06-18: qty 1

## 2023-06-18 NOTE — ED Provider Notes (Signed)
 [facility] Provider Note   CSN: [id number] Arrival date & time: 06/17/23  1912     History  Chief Complaint  Patient presents with   Extremity Weakness   Leg Pain    Crystal Howell is a 33 y.o. female.  Patient here with leg pain and cramping for the past 4 days.  She is concerned her potassium could be low as this has happened in the past.  She describes cramping feeling from her knees down to both sides of her legs bilaterally.  Denies any fall or injury.  No back pain.  Taking Tylenol without relief.  Does not take any diuretics.  No chest pain or shortness of breath.  No fever.  No numbness or tingling.  Just crampy pain to her lower extremities.  No bowel or bladder incontinence.  No fever or vomiting.  No pain with urination or blood in the urine.  No chest pain or shortness of breath.  Pain does not go beyond her knees bilaterally.  The history is provided by the patient and a relative.  Extremity Weakness Pertinent negatives include no chest pain, no abdominal pain, no headaches and no shortness of breath.  Leg Pain Associated symptoms: no back pain and no fever        Home Medications Prior to Admission medications   Medication Sig Start Date End Date Taking? Authorizing Provider  cholecalciferol (VITAMIN D3) 25 MCG (1000 UNIT) tablet Take 1,000 Units by mouth daily.    [provider]  levothyroxine (SYNTHROID) 50 MCG tablet Take 50 mcg by mouth daily before breakfast.    [provider]  oxyCODONE-acetaminophen (PERCOCET/ROXICET) 5-325 MG tablet Take 1 tablet by mouth every 6 (six) hours as needed for severe pain (pain score 7-10). Patient not taking: Reported on 05/31/2023 05/14/23   Lorre Nick, MD      Allergies    Doxycycline, Erythromycin, Nabumetone, and Sertraline hcl    Review of Systems   Review of Systems  Constitutional:  Negative for activity change, appetite change and fever.  HENT:   Negative for congestion and rhinorrhea.   Respiratory:  Negative for cough, chest tightness and shortness of breath.   Cardiovascular:  Negative for chest pain.  Gastrointestinal:  Negative for abdominal pain, nausea and vomiting.  Genitourinary:  Negative for dysuria and hematuria.  Musculoskeletal:  Positive for arthralgias, extremity weakness and myalgias. Negative for back pain.  Skin:  Negative for rash.  Neurological:  Positive for weakness. Negative for dizziness and headaches.   all other systems are negative except as noted in the HPI and PMH. \  Physical Exam Updated Vital Signs BP 121/84 (BP Location: Right Arm)   Pulse 77   Temp 98.2 F (36.8 C)   Resp 18   LMP 05/23/2023 (Approximate)   SpO2 100%  Physical Exam Vitals and nursing note reviewed.  Constitutional:      General: She is not in acute distress.    Appearance: She is well-developed.  HENT:     Head: Normocephalic and atraumatic.     Mouth/Throat:     Pharynx: No oropharyngeal exudate.  Eyes:     Conjunctiva/sclera: Conjunctivae normal.     Pupils: Pupils are equal, round, and reactive to light.  Neck:     Comments: No meningismus. Cardiovascular:     Rate and Rhythm: Normal rate and regular rhythm.     Heart sounds: Normal heart sounds. No murmur heard. Pulmonary:  Effort: Pulmonary effort is normal. No respiratory distress.     Breath sounds: Normal breath sounds.  Abdominal:     Palpations: Abdomen is soft.     Tenderness: There is no abdominal tenderness. There is no guarding or rebound.  Musculoskeletal:        General: Tenderness present. Normal range of motion.     Cervical back: Normal range of motion and neck supple.     Comments: Bilateral calf tenderness.  Compartments soft.  Full range of motion of bilateral hips, knees and ankles.  5/5 strength in bilateral lower extremities. Ankle plantar and dorsiflexion intact. Great toe extension intact bilaterally. +2 DP and PT pulses. +2  patellar reflexes bilaterally. Normal gait.   Skin:    General: Skin is warm.  Neurological:     Mental Status: She is alert and oriented to person, place, and time.     Cranial Nerves: No cranial nerve deficit.     Motor: No abnormal muscle tone.     Coordination: Coordination normal.     Comments:  5/5 strength throughout. CN 2-12 intact.Equal grip strength.   Psychiatric:        Behavior: Behavior normal.     ED Results / Procedures / Treatments   Labs (all labs ordered are listed, but only abnormal results are displayed) Labs Reviewed  BASIC METABOLIC PANEL - Abnormal; Notable for the following components:      Result Value   CO2 19 (*)    All other components within normal limits  URINALYSIS, ROUTINE W REFLEX MICROSCOPIC - Abnormal; Notable for the following components:   Hgb urine dipstick MODERATE (*)    All other components within normal limits  URINALYSIS, MICROSCOPIC (REFLEX) - Abnormal; Notable for the following components:   Bacteria, UA RARE (*)    All other components within normal limits  D-DIMER, QUANTITATIVE - Abnormal; Notable for the following components:   D-Dimer, Quant 0.65 (*)    All other components within normal limits  CBC  PREGNANCY, URINE  MAGNESIUM  CK    EKG EKG Interpretation Date/Time:  Monday June 17 2023 19:29:50 EDT Ventricular Rate:  75 PR Interval:  162 QRS Duration:  84 QT Interval:  367 QTC Calculation: 410 R Axis:   74  Text Interpretation: Sinus rhythm Low voltage, precordial leads No significant change was found Confirmed by Glynn Octave [phone]) on 06/18/2023 12:22:10 AM  Radiology US Venous Img Lower Bilateral Result Date: 06/18/2023 CLINICAL DATA:  Bilateral leg pain, positive D-dimer EXAM: BILATERAL LOWER EXTREMITY VENOUS DOPPLER ULTRASOUND TECHNIQUE: Gray-scale sonography with compression, as well as color and duplex ultrasound, were performed to evaluate the deep venous system(s) from the level of the common  femoral vein through the popliteal and proximal calf veins. COMPARISON:  None Available. FINDINGS: VENOUS Normal compressibility of the common femoral, superficial femoral, and popliteal veins, as well as the visualized calf veins. Visualized portions of profunda femoral vein and great saphenous vein unremarkable. No filling defects to suggest DVT on grayscale or color Doppler imaging. Doppler waveforms show normal direction of venous flow, normal respiratory plasticity and response to augmentation. OTHER None. Limitations: none IMPRESSION: Negative. Electronically Signed   By: Helyn Numbers M.D.   On: 06/18/2023 03:56    Procedures Procedures    Medications Ordered in ED Medications  ketorolac (TORADOL) 30 MG/ML injection 15 mg (has no administration in time range)    ED Course/ Medical Decision Making/ A&P  Medical Decision Making Amount and/or Complexity of Data Reviewed Labs: ordered. Decision-making details documented in ED Course. Radiology: ordered and independent interpretation performed. Decision-making details documented in ED Course. ECG/medicine tests: ordered and independent interpretation performed. Decision-making details documented in ED Course.  Risk Prescription drug management.   3 days of bilateral lower extremity pain and cramping with concern for hypokalemia.  Neurovascularly intact with soft compartments.  Intact distal strength, sensation, pulses and reflexes.  Low concern for cord compression or cauda equina.  Will hydrate, check electrolytes including CK.  hCG is negative  Patient p.o. hydration and IM Toradol.  hCG is negative.  Potassium is normal at 3.8.  Magnesium was normal.  CK is normal.  Cramps have improved with Toradol and fluids.  Suspect mild dehydration.  Potassium at lower end of normal but was given additional dose.  Magnesium is normal.  CK is normal.  Doppler negative for DVT.  Low suspicion for  PE.  Encourage hydration at home with electrolyte solution such as Gatorade.  Follow-up with PCP.  Return precautions discussed.       Final Clinical Impression(s) / ED Diagnoses Final diagnoses:  None    Rx / DC Orders ED Discharge Orders     None         Tallia Moehring, Jeannett Senior, MD 06/18/23 [phone]

## 2023-06-18 NOTE — Discharge Instructions (Signed)
 Your blood work is reassuring.  Your potassium was at the low end of normal and you were given initial dose of potassium.  Increase your oral hydration at home preferably with electrolyte solution such as Gatorade or Powerade.  Follow-up with your primary doctor for recheck.  Return to the ED with new or worsening symptoms.

## 2023-06-19 ENCOUNTER — Ambulatory Visit (INDEPENDENT_AMBULATORY_CARE_PROVIDER_SITE_OTHER): Payer: PRIVATE HEALTH INSURANCE | Admitting: Internal Medicine

## 2023-06-19 ENCOUNTER — Encounter: Payer: Self-pay | Admitting: Internal Medicine

## 2023-06-19 VITALS — BP 112/72 | HR 100 | Ht 65.0 in | Wt 210.0 lb

## 2023-06-19 DIAGNOSIS — K5732 Diverticulitis of large intestine without perforation or abscess without bleeding: Secondary | ICD-10-CM | POA: Diagnosis not present

## 2023-06-19 DIAGNOSIS — K769 Liver disease, unspecified: Secondary | ICD-10-CM | POA: Diagnosis not present

## 2023-06-19 MED ORDER — SUFLAVE 178.7 G PO SOLR: 1.0000 | Freq: Once | ORAL | 0 refills | Status: AC

## 2023-06-19 NOTE — Progress Notes (Addendum)
 Crystal Howell 33 y.o. 05-21-1990 [id number]  Assessment & Plan:   Encounter Diagnoses  Name Primary?   Diverticulitis of colon Yes   Liver lesion, left lobe - suspect hemangioma    Schedule colonoscopy to evaluate colon after suspected initial episode of diverticulitis.  Rule out neoplasia unlikely but possible.  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  Briefly discussed liver lesion today and suspect it is a benign hemangioma.  Consider repeat imaging question MRI this summer.  Will discuss.  She could actually need an MRI as part of her gynecologic workup so we will hold off scheduling anything because of that as well.  CC: Crystal Howell, [place]   Subjective:   Chief Complaint: Diverticulitis  HPI 33 year old woman with a new diagnosis of diverticulitis made in December 2024 by CT scan when she presented with left-sided abdominal pain and this was ordered by primary care.  She was treated with antibiotics and recovered.  She had problems again in February with pain and went to the ER.  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  She had ultrasounds and torsion was ruled out.  She has been seen by gynecology and there are plans for endometrial biopsy and follow-up and medical and likely surgical treatment of these problems.  At this time abdominal pain is under control though she can feel her enlarged uterus.  She is not having any constipation or diarrhea or rectal bleeding.  When she had the pain previously she did have some diarrhea.  No family history of colon cancer.  GI review of systems otherwise negative except for some occasional pill dysphagia with her thyroid medication in the  mornings.  She had been seen by [facility] after her diverticulitis but requested a transfer of care, her mother is my patient.  Colonoscopy was ordered but not completed.  03/14/2023 CT abdomen pelvis with contrast [facility] IMPRESSION:  1. Focal thickening of the wall of the splenic flexure of the colon  with adjacent fat stranding suspicious for acute colitis, likely  diverticulitis. No evidence of perforation or abscess. Correlation  with colonoscopy is recommended when patient condition allows.  2. Markedly enlarged fibroid uterus.  3. 8.3 x 5.7 x 6.0 cm right pelvis and 6.0 x 3.7 x 5.0 cm left  pelvis, predominantly lipomatous masses with soft tissue nodules are  most likely related to ovarian dermoids. Surgical consultation is  recommended. Further evaluation with contrast-enhanced MRI of the  pelvis should be performed, preferably on outpatient basis when  patient able to best hold their breath.   03/18/2023 CT abdomen pelvis with contrast [facility] IMPRESSION:  1. Improving acute uncomplicated diverticulitis at the splenic  flexure.  2. Unchanged 1.4 cm round homogeneously enhancing lesion in the left  liver, stable since April. This is favored to represent a benign  lesion such as a flash filling hemangioma. If definitive  characterization is required, consider follow-up MRI abdomen with  and without contrast in 6 months.  3. Unchanged markedly enlarged fibroid uterus.  4. Unchanged bilateral ovarian dermoids.   05/14/2023 CT scan abdomen pelvis [facility] IMPRESSION: 1. Markedly enlarged fibroid uterus measuring up to 20 cm. 2. Interval enlarged left ovary in  the setting of dermoid cyst/teratoma measuring up to 8 cm on the left and 9 cm on the right. Underlying ovarian torsion is not excluded. Given size of pelvic findings, a pelvic ultrasound may be limited. Recommend MRI with and without contrast of the mid to lower abdomen and pelvis for further evaluation. 3. Colonic  diverticulosis with no acute diverticulitis. Lab Results  Component Value Date   WBC 5.5 06/18/2023   HGB 12.1 06/18/2023   HCT 37.1 06/18/2023   MCV 87.9 06/18/2023   PLT 221 06/18/2023   She was in the ED yesterday with leg pain and a positive D-dimer but a DVT evaluation of the lower extremities was negative. Allergies  Allergen Reactions   Doxycycline     Stomach pain   Erythromycin     Stomach pain   Nabumetone     Stomach upset   Sertraline Hcl     Other reaction(s): Muscle twitches   Current Meds  Medication Sig   cholecalciferol (VITAMIN D3) 25 MCG (1000 UNIT) tablet Take 1,000 Units by mouth daily.   levothyroxine (SYNTHROID) 50 MCG tablet Take 50 mcg by mouth daily before breakfast.   PEG 3350-KCl-NaCl-NaSulf-MgSul (SUFLAVE) 178.7 g SOLR Take 1 kit by mouth once for 1 dose.   Past Medical History:  Diagnosis Date   Allergy    Anemia    Anxiety 2020   Chiari malformation    Depression    Diverticulitis    Fibroids    Headache(784.0)    Hx of menorrhagia    Hypothyroidism    Thyroid disease    TMJ (dislocation of temporomandibular joint)    Uterine leiomyoma    Vertigo    Past Surgical History:  Procedure Laterality Date   WISDOM TOOTH EXTRACTION     Social History   Social History Narrative   Patient resides with mother, consumes rarely caffeine   Patient is single with no children   She has an appointment scheduled [facility]   Never smoker/tobacco no alcohol caffeine or drug use   family history includes Anemia in her mother; Cancer in her maternal aunt and paternal aunt; Emphysema in her maternal grandmother; Heart disease in her maternal grandfather and maternal grandmother; Liver disease in her father; Lung cancer in her maternal grandfather; Mental illness in her maternal grandmother; Stroke in her maternal grandmother; Thyroid disease in her mother.   Review of Systems As per HPI. \Also insomnia dysmenorrhea some  anxious and depressed mood and fatigue.  Otherwise negative.  Objective:   Physical Exam @BP  112/72   Pulse 100   Ht 5\' 5"  (1.651 m)   Wt 210 lb (95.3 kg)   LMP 05/23/2023 (Approximate)   BMI 34.95 kg/m @  General:  Well-developed, well-nourished and in no acute distress Eyes:  anicteric. ENT:   Mouth and posterior pharynx free of lesions.  Lungs: Clear to auscultation bilaterally. Heart:   S1S2, no rubs, murmurs, gallops. Abdomen:  soft, non-tender, no hepatosplenomegaly, hernia,  and BS+. Uterus palpable and enlarged - 20 week size Rectal: deferred Lymph:  no cervical or supraclavicular adenopathy. Extremities:   no edema, cyanosis or clubbing Neuro:  A&O x 3.  Psych:  appropriate mood and  Affect.   Data Reviewed: See HPI I have reviewed gynecology notes ER visits imaging and labs in the EMR since December (including Care Everywhere)

## 2023-06-19 NOTE — Patient Instructions (Signed)
 You have been scheduled for a colonoscopy. Please follow written instructions given to you at your visit today.   If you use inhalers (even only as needed), please bring them with you on the day of your procedure.  DO NOT TAKE 7 DAYS PRIOR TO TEST- Trulicity (dulaglutide) Ozempic, Wegovy (semaglutide) Mounjaro (tirzepatide) Bydureon Bcise (exanatide extended release)  DO NOT TAKE 1 DAY PRIOR TO YOUR TEST Rybelsus (semaglutide) Adlyxin (lixisenatide) Victoza (liraglutide) Byetta (exanatide) ___________________________________________________________________________  Bonita Quin will receive your bowel preparation through [facility], which ensures the lowest copay and home delivery, with outreach via text or call from an [phone] number. Please respond promptly to avoid rescheduling of your procedure. If you are interested in alternative options or have any questions regarding your prep, please contact them at [phone] ____________________________________________________________________________  Your Provider Has Sent Your Bowel Prep Regimen To [facility]   [facility] will contact you to verify your information and collect your copay, if applicable. Enjoy the comfort of your home while your prescription is mailed to you, FREE of any shipping charges.   [facility] accepts all major insurance benefits and applies discounts & coupons.  Have additional questions?   Chat: [email or web address] Call: [phone] Email: [email or web address] NCPDP: [id number]  How will [facility] contact you?  With a Welcome phone call,  a Welcome text and a checkout link in text form.  Texts you receive from [phone] Are NOT Spam.  *To set up delivery, you must complete the checkout process via link or speak to one of the patient care representatives. If [facility] is unable to reach you, your prescription may be delayed.  To avoid long hold times on the phone, you may also utilize the secure chat  feature on the [facility] website to request that they call you back for transaction completion or to expedite your concerns.   I appreciate the opportunity to care for you. Stan Head, MD, [facility]

## 2023-07-05 ENCOUNTER — Other Ambulatory Visit (HOSPITAL_COMMUNITY)
Admission: RE | Admit: 2023-07-05 | Discharge: 2023-07-05 | Disposition: A | Discharge status: Home / Self Care | Payer: PRIVATE HEALTH INSURANCE | Source: Ambulatory Visit | Attending: Obstetrics and Gynecology | Admitting: Obstetrics and Gynecology

## 2023-07-05 ENCOUNTER — Ambulatory Visit (INDEPENDENT_AMBULATORY_CARE_PROVIDER_SITE_OTHER): Payer: PRIVATE HEALTH INSURANCE | Admitting: Obstetrics and Gynecology

## 2023-07-05 VITALS — BP 118/62 | HR 89 | Temp 98.2°F | Wt 210.0 lb

## 2023-07-05 DIAGNOSIS — D259 Leiomyoma of uterus, unspecified: Secondary | ICD-10-CM | POA: Diagnosis present

## 2023-07-05 DIAGNOSIS — N939 Abnormal uterine and vaginal bleeding, unspecified: Secondary | ICD-10-CM | POA: Insufficient documentation

## 2023-07-05 DIAGNOSIS — Z01812 Encounter for preprocedural laboratory examination: Secondary | ICD-10-CM

## 2023-07-05 DIAGNOSIS — Z124 Encounter for screening for malignant neoplasm of cervix: Secondary | ICD-10-CM | POA: Insufficient documentation

## 2023-07-05 DIAGNOSIS — R102 Pelvic and perineal pain: Secondary | ICD-10-CM

## 2023-07-05 LAB — PREGNANCY, URINE: Preg Test, Ur: NEGATIVE

## 2023-07-05 MED ORDER — LIDOCAINE HCL (PF) 1 % IJ SOLN
10.0000 mL | Freq: Once | INTRAMUSCULAR | Status: DC
Start: 2023-07-05 — End: 2023-08-02

## 2023-07-05 MED ORDER — MYFEMBREE 40-1-0.5 MG PO TABS
1.0000 | ORAL_TABLET | Freq: Every day | ORAL | 11 refills | Status: AC
Start: 2023-07-05 — End: ?

## 2023-07-05 NOTE — Patient Instructions (Signed)
 It is common to have vaginal bleeding and cramping for up to 72 hours after your biopsy. Please call our office with heavy vaginal bleeding, severe abdominal pain or fever. Avoid intercourse, tampon use, douching and baths for 7 days to decrease the risk of infection.

## 2023-07-05 NOTE — Progress Notes (Signed)
 33 y.o. G0P0 female with uterine fibroids here for endometrial biopsy. Single. ENT outpt appt scheduler.  Patient's last menstrual period was 06/18/2023 (approximate). Period Duration (Days): 7-8 Period Pattern: Regular Menstrual Flow: Heavy Menstrual Control: Maxi pad, Tampon Dysmenorrhea: (!) Moderate (at time severe) Dysmenorrhea Symptoms: Nausea, Diarrhea  Patient was seen by Dr. Burman Foster, [facility] surgeon in [facility] on 05/25/2022.  Prescribe TXA with plans for an MRI. She has expelled the Mirena due to heavy vaginal bleeding. Tried OCPs and NSAIDs without improvement in bleeding.  On 05/31/2023, she presented for secondary consultation reporting ongoing HVB, bulk symptoms including left lower quadrant pain and occasional dyspareunia Desires fertility sparing treatments.  03/18/23 Hgb 12.3  03/14/2019 14 cm uterus with 7 cm exophytic posterior fibroid and 5 cm fundal fibroid. 05/14/2023 TVUS: 20 cm uterus, 5.2 cm and 4 cm heterogeneous mass of the left and right ovary, thought to be a fat-containing mass on CT imaging. 05/14/23 CT ab/p: largest fibroid 8.5cm, no definitive count provided  Birth control: Abstinent Sexually active: No  GYN HISTORY: No significant history   OB History  Gravida Para Term Preterm AB Living  0       SAB IAB Ectopic Multiple Live Births          Past Medical History:  Diagnosis Date   Allergy    Anemia    Anxiety 2020   Chiari malformation    Depression    Diverticulitis    Fibroids    Headache(784.0)    Hx of menorrhagia    Hypothyroidism    Thyroid disease    TMJ (dislocation of temporomandibular joint)    Uterine leiomyoma    Vertigo     Past Surgical History:  Procedure Laterality Date   WISDOM TOOTH EXTRACTION      Current Outpatient Medications on File Prior to Visit  Medication Sig Dispense Refill   cholecalciferol (VITAMIN D3) 25 MCG (1000 UNIT) tablet Take 1,000 Units by mouth daily.     levothyroxine  (SYNTHROID) 50 MCG tablet Take 50 mcg by mouth daily before breakfast.     No current facility-administered medications on file prior to visit.    Allergies  Allergen Reactions   Doxycycline     Stomach pain   Erythromycin     Stomach pain   Nabumetone     Stomach upset   Sertraline Hcl     Other reaction(s): Muscle twitches      PE Today's Vitals   07/05/23 0957  BP: 118/62  Pulse: 89  Temp: 98.2 F (36.8 C)  TempSrc: Oral  SpO2: 99%  Weight: 210 lb (95.3 kg)   Body mass index is 34.95 kg/m.  Physical Exam Vitals reviewed. Exam conducted with a chaperone present.  Constitutional:      General: She is not in acute distress.    Appearance: Normal appearance.  HENT:     Head: Normocephalic and atraumatic.     Nose: Nose normal.  Eyes:     Extraocular Movements: Extraocular movements intact.     Conjunctiva/sclera: Conjunctivae normal.  Pulmonary:     Effort: Pulmonary effort is normal.  Abdominal:     Comments: uterus palpated at umbilicus along left side, fullness of pelvis noted    Genitourinary:    General: Normal vulva.     Exam position: Lithotomy position.     Vagina: Normal. No vaginal discharge.     Cervix: Normal. No cervical motion tenderness, discharge or lesion.  Uterus: Normal. Not enlarged and not tender.      Adnexa: Right adnexa normal and left adnexa normal.     Comments: Uterus along patient's left posterior side   Musculoskeletal:        General: Normal range of motion.     Cervical back: Normal range of motion.  Neurological:     General: No focal deficit present.     Mental Status: She is alert.  Psychiatric:        Mood and Affect: Mood normal.        Behavior: Behavior normal.     Procedure Consent was signed. Timeout was performed. Speculum inserted into the vagina, cervix visualized and was prepped with Betadine.  Cervical block was performed with 10cc 1% lidocaine (Lot#: [id number], Exp 11/2024). A single-toothed  tenaculum was placed on the anterior lip of the cervix to stabilize it.  The 3 mm pipelle was introduced into the endometrial cavity without difficulty to a depth of 16cm, suction initiated and a moderate amount of tissue was obtained over 2 passes and sent to pathology.  The instruments were removed from the patient's vagina.  Minimal bleeding from the cervix was noted.  The patient tolerated the procedure well.     Assessment and Plan:        Abnormal uterine bleeding (AUB) Assessment & Plan: Symptomatic fibroids with bulk sx and AUB. Uterus also significantly enlarged since 2020. Imaging reviewed in detailed. EMB performed today for this reason, uncomplicated. Will also plan for preoperative MRI for surgical planning Discussed fertility sparing surgical management in the form of myomectomy and future obstetrical risk involved Also discussed [place] Will start Myfembree, can continue for up to 6 months.  Return office in 3 months.  Will review MRI at that time and schedule surgery. All questions answered  Orders: -     Lidocaine HCl (PF) -     Surgical pathology  Uterine leiomyoma, unspecified location Assessment & Plan: As above  Orders: -     Surgical pathology -     MR BRAIN W WO CONTRAST; Future -     Myfembree; Take 1 tablet by mouth daily.  Dispense: 28 tablet; Refill: 11  Pelvic pain Assessment & Plan: AS above   Cervical cancer screening -     Cytology - PAP  Pre-procedure lab exam -     Pregnancy, urine     Rosalyn Gess, MD

## 2023-07-08 ENCOUNTER — Telehealth: Payer: Self-pay

## 2023-07-08 ENCOUNTER — Encounter: Payer: Self-pay | Admitting: Obstetrics and Gynecology

## 2023-07-08 DIAGNOSIS — N939 Abnormal uterine and vaginal bleeding, unspecified: Secondary | ICD-10-CM | POA: Insufficient documentation

## 2023-07-08 DIAGNOSIS — R102 Pelvic and perineal pain: Secondary | ICD-10-CM | POA: Insufficient documentation

## 2023-07-08 DIAGNOSIS — D259 Leiomyoma of uterus, unspecified: Secondary | ICD-10-CM | POA: Insufficient documentation

## 2023-07-08 LAB — CYTOLOGY - PAP
Adequacy: ABSENT
Comment: NEGATIVE
Diagnosis: NEGATIVE
High risk HPV: NEGATIVE

## 2023-07-08 LAB — SURGICAL PATHOLOGY

## 2023-07-08 NOTE — Assessment & Plan Note (Signed)
 AS above

## 2023-07-08 NOTE — Telephone Encounter (Signed)
 A prior authorization has been submitted under [email or web address] for medication: MyFembree 40-1-0.5 MG tablets  Key: [id number]  Please allow up to 72 hours for response.

## 2023-07-08 NOTE — Assessment & Plan Note (Signed)
 As above.

## 2023-07-08 NOTE — Telephone Encounter (Signed)
 [facility] called and left message requesting return call regarding PA for MyFembree  [phone]  REF #[id number]   Store [phone]

## 2023-07-08 NOTE — Assessment & Plan Note (Signed)
 Symptomatic fibroids with bulk sx and AUB. Uterus also significantly enlarged since 2020. Imaging reviewed in detailed. EMB performed today for this reason, uncomplicated. Will also plan for preoperative MRI for surgical planning Discussed fertility sparing surgical management in the form of myomectomy and future obstetrical risk involved Also discussed [place] Will start Myfembree, can continue for up to 6 months.  Return office in 3 months.  Will review MRI at that time and schedule surgery. All questions answered

## 2023-07-09 ENCOUNTER — Encounter: Payer: Self-pay | Admitting: Obstetrics and Gynecology

## 2023-07-11 ENCOUNTER — Telehealth: Payer: Self-pay | Admitting: *Deleted

## 2023-07-11 NOTE — Telephone Encounter (Signed)
 Patient left message on triage line stating she was still experiencing some bleeding after her procedure last week. Requesting return call.   EMB 07/05/23  Left message to call [facility] Triage at [phone], option 4.

## 2023-07-11 NOTE — Telephone Encounter (Signed)
 Spoke with patient, advised no additional recommendations.  Patient states she went to the restroom since our call and she noticed a few small clots, dime size. No change in bleeding. No new symptoms. Reviewed recommendations and advised patient to call if any changes/concerns/new symptoms.   Patient agreeable.   Routing to provider for final review. Patient is agreeable to disposition. Will close encounter.

## 2023-07-11 NOTE — Telephone Encounter (Signed)
 Spoke with patient. EMB on 07/05/23.   Patient reports bleeding light after EMB, has increased to moderate flow. Changing a full, not saturated pad q3h.   LMP 06/18/23, states she is usually regular, not due to start until next week.   Cramping has resolved.  Denies fever/chills, N/V, tenderness, odor.   Reports increased urinary frequency, voiding normal amounts, denies any other symptoms. States this was discussed at OV.   Advised patient to continue to monitor bleeding, if bleeding becomes heavy -changing saturated pad q1-2 hours or any new symptoms develop, contact the office.   Advised I will send to Dr. Kennith Center to review and our office will f/u with any additional recommendations. Patient agreeable.   Dr. Kennith Center -please review

## 2023-07-15 NOTE — Telephone Encounter (Signed)
 Called received from [facility] inquiring about Myfembree PA. Advised of the denial. However, we're awaiting for Dr. Kennith Center response on moving forward w/ an appeal or an alternative.

## 2023-07-17 NOTE — Addendum Note (Signed)
 Addended by: Darrell Jewel V on: 07/17/2023 01:17 PM   Modules accepted: Orders

## 2023-07-17 NOTE — Telephone Encounter (Signed)
 Appeals letter completed and left with [facility]. Please print last appt note and send with letter. Thanks.

## 2023-07-22 NOTE — Telephone Encounter (Signed)
 Received fax notification from [facility] regarding Approval of prior authorization for Myfembree 40 mg. This prescription can now be filled at pharmacy.  Called and left voicemail for patient.  Decision: APPROVED Drug Name: MYFEMBREE 40 MG-1 MG-0.5 MG TB  Valid Thru: 07/08/2023 - 07/17/2024  Member ID: [id number] Case number: [id number]  [facility] [id number]

## 2023-07-23 ENCOUNTER — Other Ambulatory Visit: Payer: PRIVATE HEALTH INSURANCE

## 2023-07-23 ENCOUNTER — Ambulatory Visit
Admission: RE | Admit: 2023-07-23 | Discharge: 2023-07-23 | Disposition: A | Discharge status: Home / Self Care | Payer: PRIVATE HEALTH INSURANCE | Source: Ambulatory Visit | Attending: Obstetrics and Gynecology | Admitting: Obstetrics and Gynecology

## 2023-07-23 DIAGNOSIS — D259 Leiomyoma of uterus, unspecified: Secondary | ICD-10-CM

## 2023-07-23 MED ORDER — GADOPICLENOL 0.5 MMOL/ML IV SOLN
10.0000 mL | Freq: Once | INTRAVENOUS | Status: AC | PRN
  Administered 2023-07-23: 10 mL via INTRAVENOUS

## 2023-07-25 ENCOUNTER — Encounter: Payer: Self-pay | Admitting: Internal Medicine

## 2023-07-25 ENCOUNTER — Encounter: Payer: Self-pay | Admitting: Obstetrics and Gynecology

## 2023-08-01 NOTE — Progress Notes (Unsigned)
 [facility] History and Physical   Primary Care Physician:  Karalee Oscar, PA   Reason for Procedure:   Hx of diverticulitis  Plan:    colonoscopy     HPI: Crystal Howell is a 33 y.o. female w/ recent diverticultis which was treated and respondedto Abx.   Past Medical History:  Diagnosis Date   Allergy    Anemia    Anxiety 2020   Chiari malformation    Depression    Diverticulitis    Fibroids    Headache(784.0)    Hx of menorrhagia    Hypothyroidism    Thyroid  disease    TMJ (dislocation of temporomandibular joint)    Uterine leiomyoma    Vertigo     Past Surgical History:  Procedure Laterality Date   WISDOM TOOTH EXTRACTION      Prior to Admission medications   Medication Sig Start Date End Date Taking? Authorizing Provider  cholecalciferol (VITAMIN D3) 25 MCG (1000 UNIT) tablet Take 1,000 Units by mouth daily.    [provider]  levothyroxine (SYNTHROID) 50 MCG tablet Take 50 mcg by mouth daily before breakfast.    [provider]  Relugolix-Estradiol-Norethind (MYFEMBREE ) 40-1-0.5 MG TABS Take 1 tablet by mouth daily. 07/05/23   Romaine Closs, MD    Current Outpatient Medications  Medication Sig Dispense Refill   cholecalciferol (VITAMIN D3) 25 MCG (1000 UNIT) tablet Take 1,000 Units by mouth daily.     levothyroxine (SYNTHROID) 50 MCG tablet Take 50 mcg by mouth daily before breakfast.     Relugolix-Estradiol-Norethind (MYFEMBREE ) 40-1-0.5 MG TABS Take 1 tablet by mouth daily. 28 tablet 11   Current Facility-Administered Medications  Medication Dose Route Frequency Provider Last Rate Last Admin   lidocaine  (PF) (XYLOCAINE ) 1 % injection 10 mL  10 mL Infiltration Once         Allergies as of 08/02/2023 - Review Complete 07/08/2023  Allergen Reaction Noted   Doxycycline  10/26/2011   Erythromycin  10/26/2011   Nabumetone   01/04/2014   Sertraline hcl  05/23/2021    Family History  Problem Relation Age of  Onset   Thyroid  disease Mother    Anemia Mother    Heart disease Maternal Grandmother    Emphysema Maternal Grandmother    Stroke Maternal Grandmother    Mental illness Maternal Grandmother    Lung cancer Maternal Grandfather    Heart disease Maternal Grandfather    Liver disease Father    Cancer Paternal Aunt    Cancer Maternal Aunt     Social History   Socioeconomic History   Marital status: Single    Spouse name: Not on file   Number of children: 0   Years of education: 16   Highest education level: Not on file  Occupational History   Occupation: appt scheduler  Tobacco Use   Smoking status: Never   Smokeless tobacco: Never  Vaping Use   Vaping status: Never Used  Substance and Sexual Activity   Alcohol use: No   Drug use: No   Sexual activity: Yes    Birth control/protection: None  Other Topics Concern   Not on file  Social History Narrative   Patient resides with mother, consumes rarely caffeine   Patient is single with no children   She has an appointment scheduled [facility]   Never smoker/tobacco no alcohol caffeine or drug use   Social Drivers of Corporate investment banker Strain: Not on [employer]  Insecurity: Not on file  Transportation Needs: Not on file  Physical Activity: Not on file  Stress: Not on file  Social Connections: Not on file  Intimate Partner Violence: Not on file    Review of Systems: Positive for *** All other review of systems negative except as mentioned in the HPI.  Physical Exam: Vital signs LMP 06/18/2023 (Approximate)   General:   Alert,  Well-developed, well-nourished, pleasant and cooperative in NAD Lungs:  Clear throughout to auscultation.   Heart:  Regular rate and rhythm; no murmurs, clicks, rubs,  or gallops. Abdomen:  Soft, nontender and nondistended. Normal bowel sounds.   Neuro/Psych:  Alert and cooperative. Normal mood and affect. A and O x 3   @[name]  Tammie Fall, MD, [facility]  Gastroenterology [phone] (pager) 08/01/2023 10:20 PM@

## 2023-08-02 ENCOUNTER — Encounter: Payer: Self-pay | Admitting: Internal Medicine

## 2023-08-02 ENCOUNTER — Ambulatory Visit: Payer: PRIVATE HEALTH INSURANCE | Admitting: Internal Medicine

## 2023-08-02 VITALS — BP 108/69 | HR 78 | Temp 98.3°F | Resp 21 | Ht 65.0 in | Wt 210.0 lb

## 2023-08-02 DIAGNOSIS — K5730 Diverticulosis of large intestine without perforation or abscess without bleeding: Secondary | ICD-10-CM | POA: Diagnosis present

## 2023-08-02 DIAGNOSIS — K5732 Diverticulitis of large intestine without perforation or abscess without bleeding: Secondary | ICD-10-CM

## 2023-08-02 MED ORDER — SODIUM CHLORIDE 0.9 % IV SOLN: 500.0000 mL | Freq: Once | INTRAVENOUS | Status: DC

## 2023-08-02 NOTE — Patient Instructions (Addendum)
 I saw diverticulosis. No polyps or cancer seen.  Next colonoscopy exam routine at age 33.  I appreciate the opportunity to care for you. Kenney Peacemaker, MD, [facility]  **Handouts given on Diverticulosis**  YOU HAD AN ENDOSCOPIC PROCEDURE TODAY AT THE [facility]:   Refer to the procedure report that was given to you for any specific questions about what was found during the examination.  If the procedure report does not answer your questions, please call your gastroenterologist to clarify.  If you requested that your care partner not be given the details of your procedure findings, then the procedure report has been included in a sealed envelope for you to review at your convenience later.  YOU SHOULD EXPECT: Some feelings of bloating in the abdomen. Passage of more gas than usual.  Walking can help get rid of the air that was put into your GI tract during the procedure and reduce the bloating. If you had a lower endoscopy (such as a colonoscopy or flexible sigmoidoscopy) you may notice spotting of blood in your stool or on the toilet paper. If you underwent a bowel prep for your procedure, you may not have a normal bowel movement for a few days.  Please Note:  You might notice some irritation and congestion in your nose or some drainage.  This is from the oxygen used during your procedure.  There is no need for concern and it should clear up in a day or so.  SYMPTOMS TO REPORT IMMEDIATELY:  Following lower endoscopy (colonoscopy or flexible sigmoidoscopy):  Excessive amounts of blood in the stool  Significant tenderness or worsening of abdominal pains  Swelling of the abdomen that is new, acute  Fever of 100F or higher  For urgent or emergent issues, a gastroenterologist can be reached at any hour by calling [phone]. Do not use MyChart messaging for urgent concerns.    DIET:  We do recommend a small meal at first, but then you may proceed to your regular diet.  Drink  plenty of fluids but you should avoid alcoholic beverages for 24 hours.  ACTIVITY:  You should plan to take it easy for the rest of today and you should NOT DRIVE or use heavy machinery until tomorrow (because of the sedation medicines used during the test).    FOLLOW UP: Our staff will call the number listed on your records the next business day following your procedure.  We will call around 7:15- 8:00 am to check on you and address any questions or concerns that you may have regarding the information given to you following your procedure. If we do not reach you, we will leave a message.     If any biopsies were taken you will be contacted by phone or by letter within the next 1-3 weeks.  Please call us  at [phone] if you have not heard about the biopsies in 3 weeks.    SIGNATURES/CONFIDENTIALITY: You and/or your care partner have signed paperwork which will be entered into your electronic medical record.  These signatures attest to the fact that that the information above on your After Visit Summary has been reviewed and is understood.  Full responsibility of the confidentiality of this discharge information lies with you and/or your care-partner.

## 2023-08-02 NOTE — Progress Notes (Signed)
 Vss nad trans to pacu

## 2023-08-02 NOTE — Op Note (Signed)
 [facility] Patient Name: Crystal Howell Procedure Date: 08/02/2023 1:09 PM MRN: [id number] Endoscopist: Kenney Peacemaker , MD, [id number] Age: 33 Referring MD:  Date of Birth: Dec 03, 1990 Gender: Female Account #: [id number] Procedure:                Colonoscopy Indications:              Diverticulitis, Follow-up of diverticulitis                            colonocopy after a new diagnosis of diverticulitis Medicines:                Monitored Anesthesia Care Procedure:                Pre-Anesthesia Assessment:                           - Prior to the procedure, a History and Physical                            was performed, and patient medications and                            allergies were reviewed. The patient's tolerance of                            previous anesthesia was also reviewed. The risks                            and benefits of the procedure and the sedation                            options and risks were discussed with the patient.                            All questions were answered, and informed consent                            was obtained. Prior Anticoagulants: The patient has                            taken no anticoagulant or antiplatelet agents. ASA                            Grade Assessment: II - A patient with mild systemic                            disease. After reviewing the risks and benefits,                            the patient was deemed in satisfactory condition to                            undergo the procedure.  After obtaining informed consent, the colonoscope                            was passed under direct vision. Throughout the                            procedure, the patient's blood pressure, pulse, and                            oxygen saturations were monitored continuously. The                            CF [id number] #[id number] was introduced through the anus                            and  advanced to the the cecum, identified by                            appendiceal orifice and ileocecal valve. The                            colonoscopy was performed without difficulty. The                            patient tolerated the procedure well. The quality                            of the bowel preparation was good. The ileocecal                            valve, appendiceal orifice, and rectum were                            photographed. The bowel preparation used was                            SUFLAVE  via split dose instruction. Scope In: 1:39:57 PM Scope Out: 1:50:09 PM Scope Withdrawal Time: 0 hours 8 minutes 1 second  Total Procedure Duration: 0 hours 10 minutes 12 seconds  Findings:                 The perianal and digital rectal examinations were                            normal.                           Multiple diverticula were found in the entire colon.                           The exam was otherwise without abnormality on                            direct and retroflexion views. Complications:  No immediate complications. Estimated Blood Loss:     Estimated blood loss: none. Impression:               - Diverticulosis in the entire examined colon.                           - The examination was otherwise normal on direct                            and retroflexion views.                           - No specimens collected. Recommendation:           - Patient has a contact number available for                            emergencies. The signs and symptoms of potential                            delayed complications were discussed with the                            patient. Return to normal activities tomorrow.                            Written discharge instructions were provided to the                            patient.                           - Resume previous diet.                           - Continue present medications.                            - Repeat colonoscopy age 64. Kenney Peacemaker, MD 08/02/2023 2:02:45 PM This report has been signed electronically.

## 2023-08-05 ENCOUNTER — Telehealth: Payer: Self-pay

## 2023-08-05 NOTE — Telephone Encounter (Signed)
 Post procedure follow up call, no answer

## 2023-08-19 ENCOUNTER — Encounter: Payer: Self-pay | Admitting: Obstetrics and Gynecology

## 2023-08-19 NOTE — Telephone Encounter (Signed)
 Called patient to discuss symptoms. She reports pain in bilateral knees that radiates to her feet. She feels weak along her knees and notices buckling. She has been resting for most of the day today. Pain is a little better. She has not taken anything. No leg swelling. No SOB today.  She reports she was seen in the ED on 06/17/23 for similar symptoms. Myfembree  was not started at that time. Chart reviewed noted elevated d-dimer. Bilateral lower extremity doppler US  was normal. CBC, CMP, mag wnl. Pain improved with toradol  and IVF.  Patient reports pain was been on and off since then. She has not followed up with PCP since. Reviewed this is not a reported side effect of myfembree  and since symptoms started prior to initiation of therapy, it likely has another etiology.  Recommend PO hydration, hydration pack/electrolyte drink, ibuprofen  and f/u PCP in morning.  DVT precautions provided including SOB, calf swelling and pain.

## 2023-08-19 NOTE — Telephone Encounter (Signed)
 Per GH: "These are not documented side effects of myfembree .  Please inquire about SOB, leg swelling, and calf or thigh pain. If she has these sx, please add on today.   If not, she should be seen by her PCP for back pain and leg weakness."   Pt reports that she did have some SOB yesterday but was walking a longer distance than normal.   Denies swelling in LE's. Does report pain in b/l legs that starts from top of the knee, in calf and radiates down to feet when episodes occur.  Please advise if OV here or PCP?  TIA.

## 2023-09-05 ENCOUNTER — Encounter: Payer: Self-pay | Admitting: Neurology

## 2023-10-07 ENCOUNTER — Encounter: Payer: Self-pay | Admitting: Obstetrics and Gynecology

## 2023-10-07 ENCOUNTER — Ambulatory Visit: Payer: PRIVATE HEALTH INSURANCE | Admitting: Obstetrics and Gynecology

## 2023-10-07 VITALS — BP 108/64 | HR 88 | Temp 98.2°F | Wt 209.0 lb

## 2023-10-07 DIAGNOSIS — N939 Abnormal uterine and vaginal bleeding, unspecified: Secondary | ICD-10-CM | POA: Diagnosis not present

## 2023-10-07 DIAGNOSIS — N83202 Unspecified ovarian cyst, left side: Secondary | ICD-10-CM

## 2023-10-07 DIAGNOSIS — D259 Leiomyoma of uterus, unspecified: Secondary | ICD-10-CM | POA: Diagnosis not present

## 2023-10-07 DIAGNOSIS — R102 Pelvic and perineal pain: Secondary | ICD-10-CM | POA: Diagnosis not present

## 2023-10-07 DIAGNOSIS — N83201 Unspecified ovarian cyst, right side: Secondary | ICD-10-CM | POA: Diagnosis not present

## 2023-10-07 NOTE — Progress Notes (Signed)
 33 y.o. G0P0 female with AUB, uterine fibroids, pelvic pain, bilateral ovarian dermoid cysts, Chiari 1 malformation here for f/u of AUB-F and pelvic pain.. Single. ENT outpt appt scheduler.  Patient's last menstrual period was 07/30/2023 (approximate).   Patient was seen by Dr. Gustave, [facility] surgeon in [facility] [place]  on 05/25/2022.  Prescribe TXA with plans for an MRI. She has expelled the Mirena due to heavy vaginal bleeding. Tried OCPs and NSAIDs without improvement in bleeding.  On 05/31/2023, she presented for secondary consultation reporting ongoing HVB, bulk symptoms including left lower quadrant pain and occasional dyspareunia. Desires fertility sparing treatments.  03/18/23 Hgb 12.3  03/14/2019 14 cm uterus with 7 cm exophytic posterior fibroid and 5 cm fundal fibroid. 05/14/2023 TVUS: 20 cm uterus, 5.2 cm and 4 cm heterogeneous mass of the left and right ovary, thought to be a fat-containing mass on CT imaging. 05/14/23 CT ab/p: largest fibroid 8.5cm, no definitive count provided    Component Value Date/Time   DIAGPAP  07/05/2023 1048    - Negative for intraepithelial lesion or malignancy (NILM)   HPVHIGH Negative 07/05/2023 1048   ADEQPAP  07/05/2023 1048    Satisfactory for evaluation; transformation zone component ABSENT.  07/05/23 EMB: Benign secretory endometrium  - Negative for hyperplasia or malignancy   07/23/23 Pelvic MRI: Reproductive: Extremely bulky fibroid uterus extending well into the abdomen, overall dimensions 19.9 x 15.6 x 12.8 cm (series 5, image 20, series 3, image 27). Numerous fibroids of varying sizes, several of which are with substantial submucosal components and grossly distort, but do not completely efface the endometrial cavity. Large index fibroid with a submucosal component in the anterior uterine fundus measures 7.1 x 6.4 x 4.6 cm (series 5, image 21, series 3, image 24). Largest overall fibroid, also with a small submucosal component is situated  in the anterior lower uterine segment and measures 8.4 x 6.7 x 6.3 cm (series 5, image 15, series 3, image 36). Multiple small, benign incidental nabothian cysts of the cervix, requiring no specific further follow-up or characterization. Large right ovarian dermoid composed primarily of macroscopic fat situated in the posterior pelvis measuring 8.6 x 6.2 x 6.4 cm (series 5, image 13, series 3, image 40). The left ovary is displaced high into the left hemiabdomen and also contains a large, primarily macroscopic fat containing dermoid measuring 6.8 x 4.7 x 3.6 cm (series 3, image 9, series 5, image 31).    IMPRESSION: 1. Extremely bulky fibroid uterus extending well into the abdomen, overall dimensions 19.9 x 15.6 x 12.8 cm. Numerous fibroids of varying sizes, several of which are with substantial submucosal components. 2. Large right ovarian dermoid composed primarily of macroscopic fat situated in the posterior pelvis measuring 8.6 x 6.2 x 6.4 cm.  3. The left ovary is displaced high into the left hemiabdomen and also contains a large, primarily macroscopic fat containing dermoid measuring 6.8 x 4.7 x 3.6 cm. 4. Small volume free fluid in the low pelvis.  Today, she reports she is no longer having a menstrual period on myfembree . Reports a lot of hot flashes mostly throughout the daytime.  Sometimes left pain is presents, now mild, felt a few times a week (previously daily). Seeing neurology 10/29/23 for bilateral leg weakness and trouble walking, R>L Had brain MRI ordered by PCP, on B12.  Birth control: Abstinent Sexually active: No  GYN HISTORY: No significant history   OB History  Gravida Para Term Preterm AB Living  0  SAB IAB Ectopic Multiple Live Births          Past Medical History:  Diagnosis Date   Allergy    Anemia    Anxiety 2020   Chiari malformation    Depression    Diverticulitis    Fibroids    Headache(784.0)    Hx of menorrhagia    Hypothyroidism    Thyroid   disease    TMJ (dislocation of temporomandibular joint)    Uterine leiomyoma    Vertigo     Past Surgical History:  Procedure Laterality Date   COLONOSCOPY     WISDOM TOOTH EXTRACTION      Current Outpatient Medications on File Prior to Visit  Medication Sig Dispense Refill   cholecalciferol (VITAMIN D3) 25 MCG (1000 UNIT) tablet Take 1,000 Units by mouth daily. (Patient taking differently: Take 5,000 Units by mouth daily.)     cyanocobalamin (VITAMIN B12) 1000 MCG tablet Take 1,000 mcg by mouth 3 (three) times a week.     Ferrous Sulfate (IRON PO) Take by mouth.     levothyroxine (SYNTHROID) 50 MCG tablet Take 50 mcg by mouth daily before breakfast.     Relugolix-Estradiol-Norethind (MYFEMBREE ) 40-1-0.5 MG TABS Take 1 tablet by mouth daily. 28 tablet 11   No current facility-administered medications on file prior to visit.    Allergies  Allergen Reactions   Doxycycline     Stomach pain   Erythromycin     Stomach pain   Nabumetone  Other (See Comments)    Stomach upset   Sertraline Hcl Other (See Comments)    Other reaction(s): Muscle twitches      PE Today's Vitals   10/07/23 1038  BP: 108/64  Pulse: 88  Temp: 98.2 F (36.8 C)  TempSrc: Oral  SpO2: 97%  Weight: 209 lb (94.8 kg)   Body mass index is 34.78 kg/m.  Physical Exam Vitals reviewed.  Constitutional:      General: She is not in acute distress.    Appearance: Normal appearance.  HENT:     Head: Normocephalic and atraumatic.     Nose: Nose normal.  Eyes:     Extraocular Movements: Extraocular movements intact.     Conjunctiva/sclera: Conjunctivae normal.  Pulmonary:     Effort: Pulmonary effort is normal.  Abdominal:     Comments: uterus palpated at umbilicus along left side, fullness of pelvis noted    Musculoskeletal:        General: Normal range of motion.     Cervical back: Normal range of motion.  Neurological:     General: No focal deficit present.     Mental Status: She is alert.   Psychiatric:        Mood and Affect: Mood normal.        Behavior: Behavior normal.     Assessment and Plan:        Abnormal uterine bleeding (AUB) Assessment & Plan: Symptomatic fibroids with bulk sx and AUB.  Uterus also significantly enlarged since 2020.  07/23/23 Pelvic MRI: 20cm multifibroid uterus Discussed fertility sparing surgical management in the form of myomectomy and future obstetrical risk involved Wants to continue with Myfembree , can continue for up to 24 months.  All questions answered   Uterine leiomyoma, unspecified location Assessment & Plan: As above   Pelvic pain Assessment & Plan: AS above   Bilateral ovarian cysts Assessment & Plan: Bilateral dermoid cysts, 7cm and 9cm Reviewed small chance of malignancy and torsion Would recommend removal at the  time of myomectomy    Vera LULLA Pa, MD

## 2023-10-14 ENCOUNTER — Ambulatory Visit: Payer: Self-pay | Admitting: Obstetrics and Gynecology

## 2023-10-21 ENCOUNTER — Encounter: Payer: Self-pay | Admitting: Obstetrics and Gynecology

## 2023-10-21 DIAGNOSIS — N83201 Unspecified ovarian cyst, right side: Secondary | ICD-10-CM | POA: Insufficient documentation

## 2023-10-21 NOTE — Assessment & Plan Note (Addendum)
 Symptomatic fibroids with bulk sx and AUB.  Uterus also significantly enlarged since 2020.  07/23/23 Pelvic MRI: 20cm multifibroid uterus Discussed fertility sparing surgical management in the form of myomectomy and future obstetrical risk involved Wants to continue with Myfembree , can continue for up to 24 months.  All questions answered

## 2023-10-21 NOTE — Assessment & Plan Note (Signed)
 AS above

## 2023-10-21 NOTE — Assessment & Plan Note (Signed)
 Bilateral dermoid cysts, 7cm and 9cm Reviewed small chance of malignancy and torsion Would recommend removal at the time of myomectomy

## 2023-10-21 NOTE — Assessment & Plan Note (Signed)
 As above

## 2023-10-29 ENCOUNTER — Encounter: Payer: Self-pay | Admitting: Neurology

## 2023-10-29 ENCOUNTER — Ambulatory Visit: Payer: PRIVATE HEALTH INSURANCE | Admitting: Neurology

## 2023-10-29 VITALS — BP 101/70 | HR 83 | Ht 65.0 in | Wt 215.0 lb

## 2023-10-29 DIAGNOSIS — R202 Paresthesia of skin: Secondary | ICD-10-CM

## 2023-10-29 DIAGNOSIS — G44229 Chronic tension-type headache, not intractable: Secondary | ICD-10-CM

## 2023-10-29 DIAGNOSIS — R29898 Other symptoms and signs involving the musculoskeletal system: Secondary | ICD-10-CM | POA: Diagnosis not present

## 2023-10-29 MED ORDER — CYCLOBENZAPRINE HCL 5 MG PO TABS: 5.0000 mg | ORAL_TABLET | Freq: Every day | ORAL | 3 refills | Status: AC

## 2023-10-29 NOTE — Patient Instructions (Signed)
Nerve testing of the legs

## 2023-10-29 NOTE — Progress Notes (Signed)
 [facility] Neurology Division Clinic Note - Initial Visit   Date: 10/29/2023   Crystal Howell MRN: [id number] DOB: [birth year]   Dear Schuyler Savin, PA:  Thank you for your kind referral of Crystal Howell for consultation of leg weakness. Although her history is well known to you, please allow us  to reiterate it for the purpose of our medical record. The patient was accompanied to the clinic by self.    Crystal Howell is a 33 y.o. right-handed female with hypothyroidism, insomnia, iron deficiency anemia, depression, and vitamin B12 deficiency presenting for evaluation of leg weakness.   IMPRESSION/PLAN: Spell of bilateral leg weakness and paresthesias, worse on the right.  Neurological exam shows normal strength, sensation, and reflexes.  No findings to suggest neuropathy, radiculopathy, or myelopathy. Discussed that low vitamin B12 can cause paresthesias and weakness.   - NCS/EMG bilateral leg to further localize symptoms.    2.   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  Tingling involving the right lower leg and foot.  It was occurring twice per month and lasts about 2 days.  Symptoms improve with rest.  There is no preceding warning.  No associated back pain.   She also complains of sharp headaches and tightness at the back of her head, which occurs 2-3 times per week.  It lasts 2-3 hours.  She takes ibuprofen  which helps.  She has history of Chiari I malformation.  Out-side paper records, electronic medical record, and images have been reviewed where available and summarized as:  Labs 08/22/2023:  Ferritin 7, TSH 2.270, vitamin D   24, vitamin B12 242  MRI brain 08/29/2023: 1. Chiari 1 malformation with the cerebellar tonsils extending up to  1.8 cm below the foramen magnum.  2. Empty sella. While this finding is often incidental in nature and  of no clinical significance, this can also be seen in the setting of  idiopathic intracranial hypertension.  3. Otherwise normal brain MRI.   MRI brain wo contrast 10/21/2012: Mild Chiari I malformation No acute intracranial changes Small parotid nudules, mostly commonly secondary to small cysts or lymph nodes.   Lab Results  Component Value Date   HGBA1C 5.2 08/18/2014   Lab Results  Component Value Date   VITAMINB12 238 12/20/2020   Lab Results  Component Value Date   TSH 1.080 12/20/2020   Lab Results  Component Value Date   POCTSEDRATE 14 10/14/2013    Past Medical History:  Diagnosis Date   Allergy    Anemia    Anxiety 2020   Chiari malformation    Depression    Diverticulitis    Fibroids    Headache(784.0)    Hx of menorrhagia    Hypothyroidism    Thyroid  disease    TMJ (dislocation of temporomandibular joint)    Uterine leiomyoma    Vertigo     Past Surgical History:  Procedure Laterality Date   COLONOSCOPY     WISDOM TOOTH EXTRACTION       Medications:  Outpatient Encounter Medications as of 10/29/2023  Medication Sig   cholecalciferol (VITAMIN D3) 25 MCG (1000 UNIT) tablet Take 1,000 Units by mouth daily. (Patient taking differently: Take  5,000 Units by mouth daily.)   cyanocobalamin (VITAMIN B12) 1000 MCG tablet Take 1,000 mcg by mouth 3 (three) times a week.   Ferrous Sulfate (IRON PO) Take by mouth.   levothyroxine (SYNTHROID) 50 MCG tablet Take 50 mcg by mouth daily before breakfast.   Relugolix-Estradiol-Norethind (MYFEMBREE ) 40-1-0.5 MG TABS Take 1 tablet by mouth daily.   No facility-administered encounter medications on file as of 10/29/2023.    Allergies:  Allergies  Allergen Reactions   Doxycycline     Stomach pain    Erythromycin     Stomach pain   Nabumetone  Other (See Comments)    Stomach upset   Sertraline Hcl Other (See Comments)    Other reaction(s): Muscle twitches    Family History: Family History  Problem Relation Age of Onset   Thyroid  disease Mother    Anemia Mother    Liver disease Father    Cancer Maternal Aunt    Cancer Paternal Aunt    Heart disease Maternal Grandmother    Emphysema Maternal Grandmother    Stroke Maternal Grandmother    Mental illness Maternal Grandmother    Lung cancer Maternal Grandfather    Heart disease Maternal Grandfather    Colon cancer Neg Hx    Esophageal cancer Neg Hx    Stomach cancer Neg Hx    Rectal cancer Neg Hx     Social History: Social History   Tobacco Use   Smoking status: Never   Smokeless tobacco: Never  Vaping Use   Vaping status: Never Used  Substance Use Topics   Alcohol use: No   Drug use: No   Social History   Social History Narrative   Patient resides with mother, consumes rarely caffeine   Patient is single with no children   She has an appointment scheduled for [facility]   Never smoker/tobacco no alcohol caffeine or drug use         Are you right handed or left handed? Right handed   Are you currently employed ? 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 No    Who lives with you? Mom    What type of home do you live in: 1 story or 2 story? Lives in a one story home        Vital Signs:  BP 101/70   Pulse 83   Ht 5' 5 (1.651 m)   Wt 215 lb (97.5 kg)   LMP 07/30/2023 (Approximate)   SpO2 98%   BMI 35.78 kg/m   Neurological Exam: MENTAL STATUS including orientation to time, place, person, recent and remote memory, attention span and concentration, language, and fund of knowledge is normal.  Speech is not dysarthric.  CRANIAL NERVES: II:  No visual field defects.     III-IV-VI: Pupils equal round and reactive to light.  Normal conjugate,  extra-ocular eye movements in all directions of gaze.  No nystagmus.  No ptosis.   V:  Normal facial sensation.    VII:  Normal facial symmetry and movements.   VIII:  Normal hearing and vestibular function.   IX-X:  Normal palatal movement.   XI:  Normal shoulder shrug and head rotation.   XII:  Normal tongue strength and range of motion, no deviation or fasciculation.  MOTOR:  No atrophy, fasciculations or abnormal movements.  No pronator drift.   Upper Extremity:  Right  Left  Deltoid  5/5   5/5  Biceps  5/5   5/5   Triceps  5/5   5/5   Wrist extensors  5/5   5/5   Wrist flexors  5/5   5/5   Finger extensors  5/5   5/5   Finger flexors  5/5   5/5   Dorsal interossei  5/5   5/5   Abductor pollicis  5/5   5/5   Tone (Ashworth scale)  0  0   Lower Extremity:  Right  Left  Hip flexors  5/5   5/5   Knee flexors  5/5   5/5   Knee extensors  5/5   5/5   Dorsiflexors  5/5   5/5   Plantarflexors  5/5   5/5   Toe extensors  5/5   5/5   Toe flexors  5/5   5/5   Tone (Ashworth scale)  0  0   MSRs:                                           Right        Left brachioradialis 2+  2+  biceps 2+  2+  triceps 2+  2+  patellar 2+  2+  ankle jerk 2+  2+  Hoffman no  no  plantar response down  down   SENSORY:  Normal and symmetric perception of light touch, pinprick, vibration, and temperature.  Romberg's sign absent.   COORDINATION/GAIT: Normal finger-to- nose-finger.  Intact rapid alternating movements bilaterally.  Able to rise from a chair without using arms.  Gait narrow based and stable. Tandem and stressed gait intact.      Thank you for allowing me to participate in patient's care.  If I can answer any additional questions, I would be pleased to do so.    Sincerely,    Jaremy Nosal K. Tobie, DO

## 2023-10-31 ENCOUNTER — Telehealth (HOSPITAL_BASED_OUTPATIENT_CLINIC_OR_DEPARTMENT_OTHER): Payer: Self-pay | Admitting: *Deleted

## 2023-10-31 NOTE — Telephone Encounter (Signed)
 Copied from CRM #[id number]. Topic: Appointments - Transfer of Care >> Oct 31, 2023 12:38 PM Rea C wrote: Pt is requesting to transfer FROM: DE PERU, RAYMOND J, MD  Pt is requesting to transfer TO: Tinnie RONAL Harada, NP  Reason for requested transfer: Establish new care  It is the responsibility of the team the patient would like to transfer to Sandor RONAL Harada, NP ) to reach out to the patient if for any reason this transfer is not acceptable.

## 2023-10-31 NOTE — Telephone Encounter (Signed)
 noted

## 2023-12-12 ENCOUNTER — Encounter: Payer: PRIVATE HEALTH INSURANCE | Admitting: Neurology

## 2023-12-12 ENCOUNTER — Encounter: Payer: PRIVATE HEALTH INSURANCE | Admitting: Nurse Practitioner

## 2024-01-24 IMAGING — CR DG RIBS W/ CHEST 3+V*L*
3 series · 3 of 3 positions shown · non-contrast
Comparison: Chest radiograph 02/27/2014

CLINICAL DATA: Left-sided chest wall pain.

EXAM:
LEFT RIBS AND CHEST - 3+ VIEW

[w chest pa]
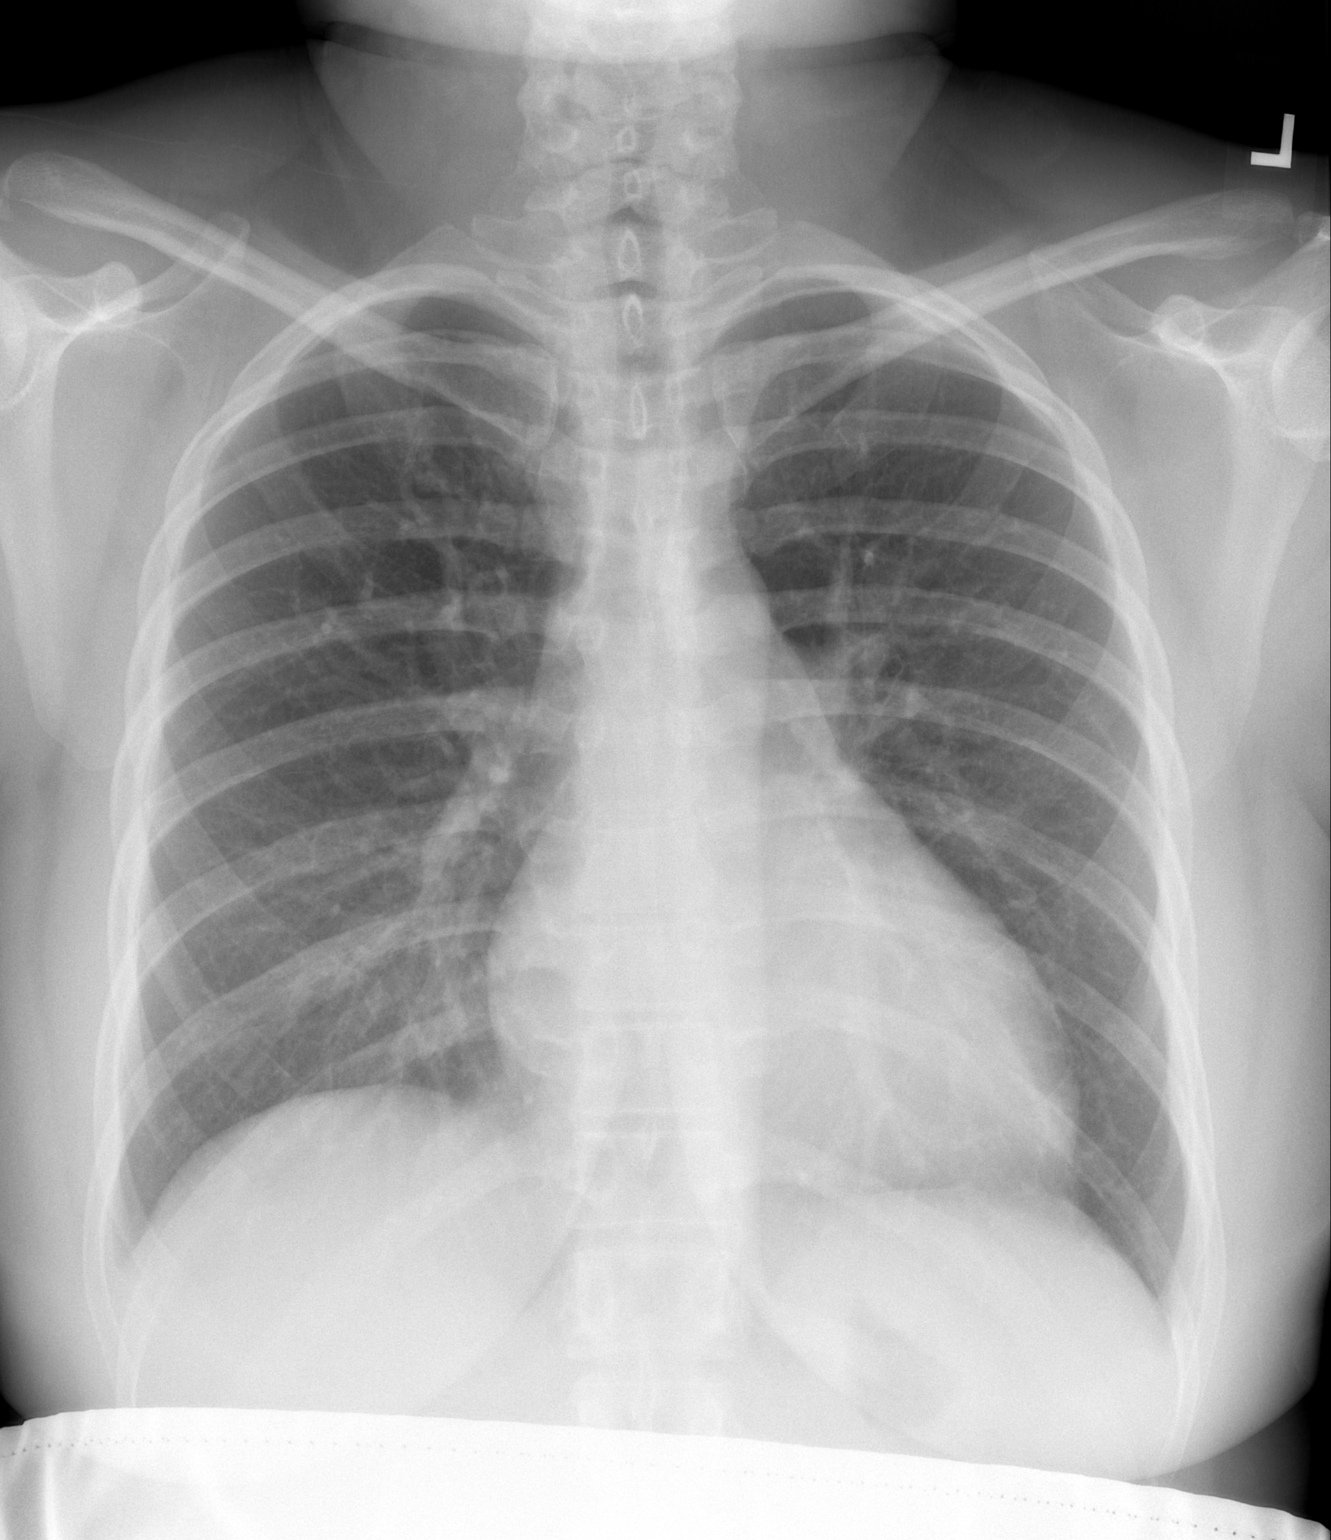

[w ribs ap/pa upper left *]
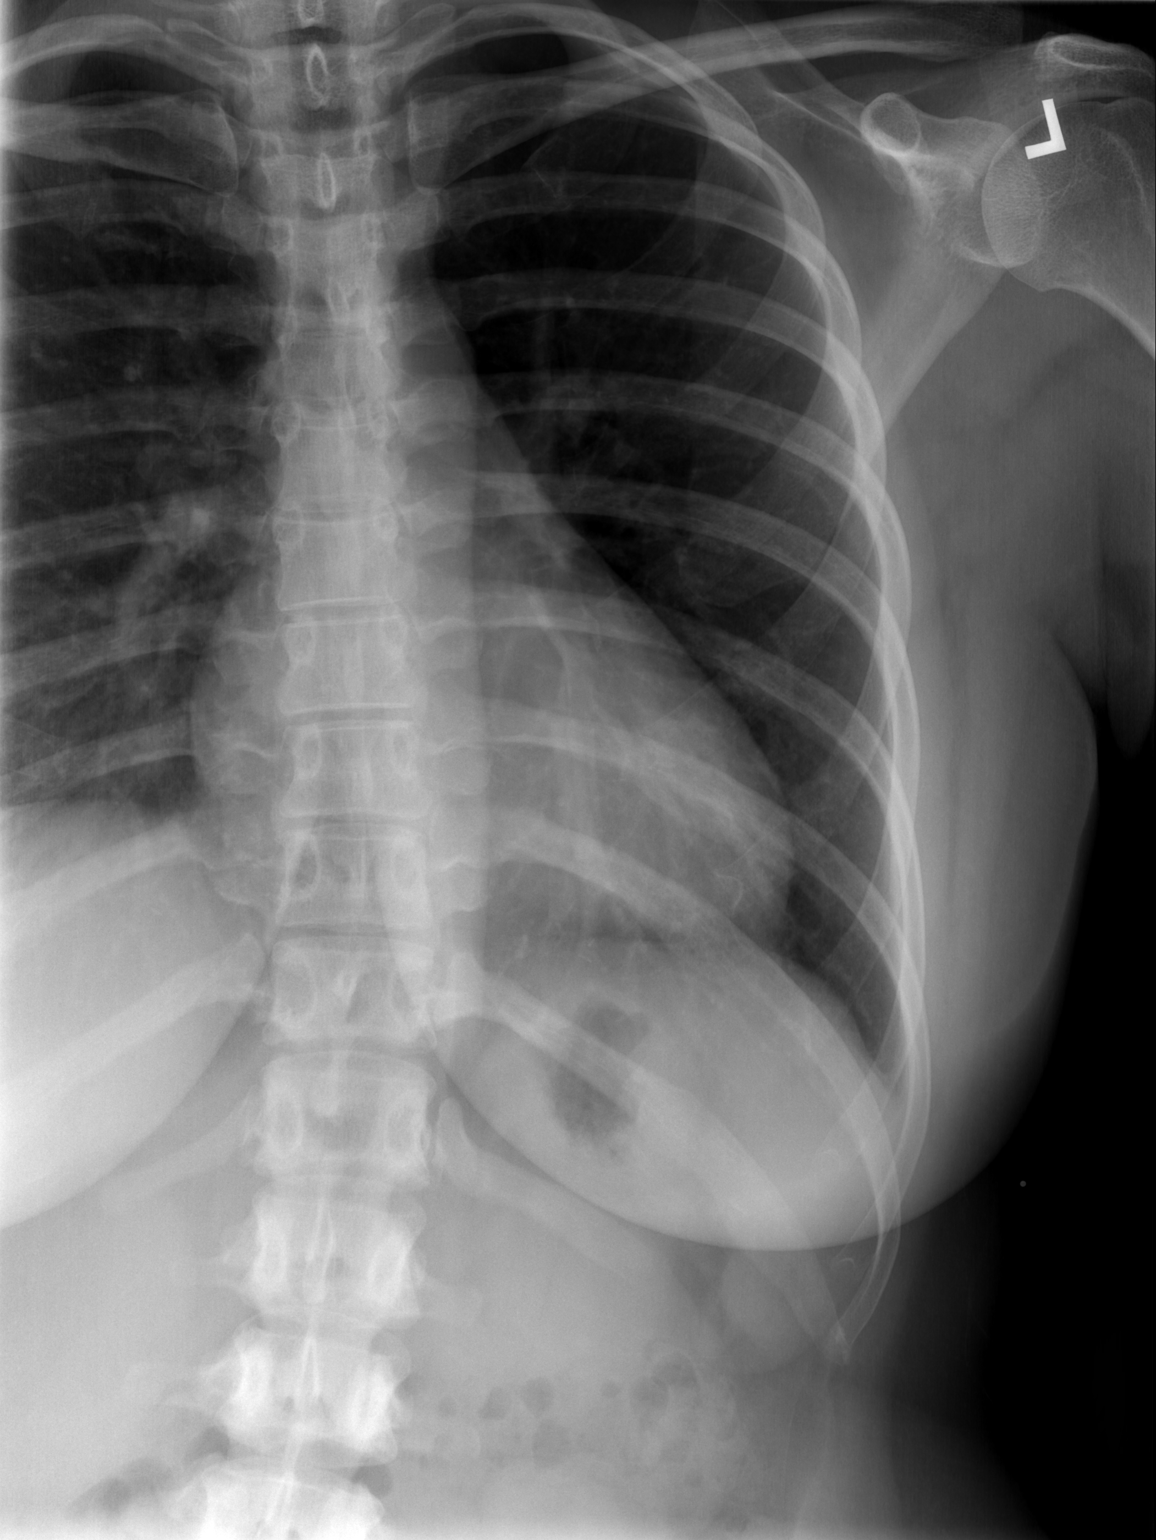

[w ribs oblique left *]
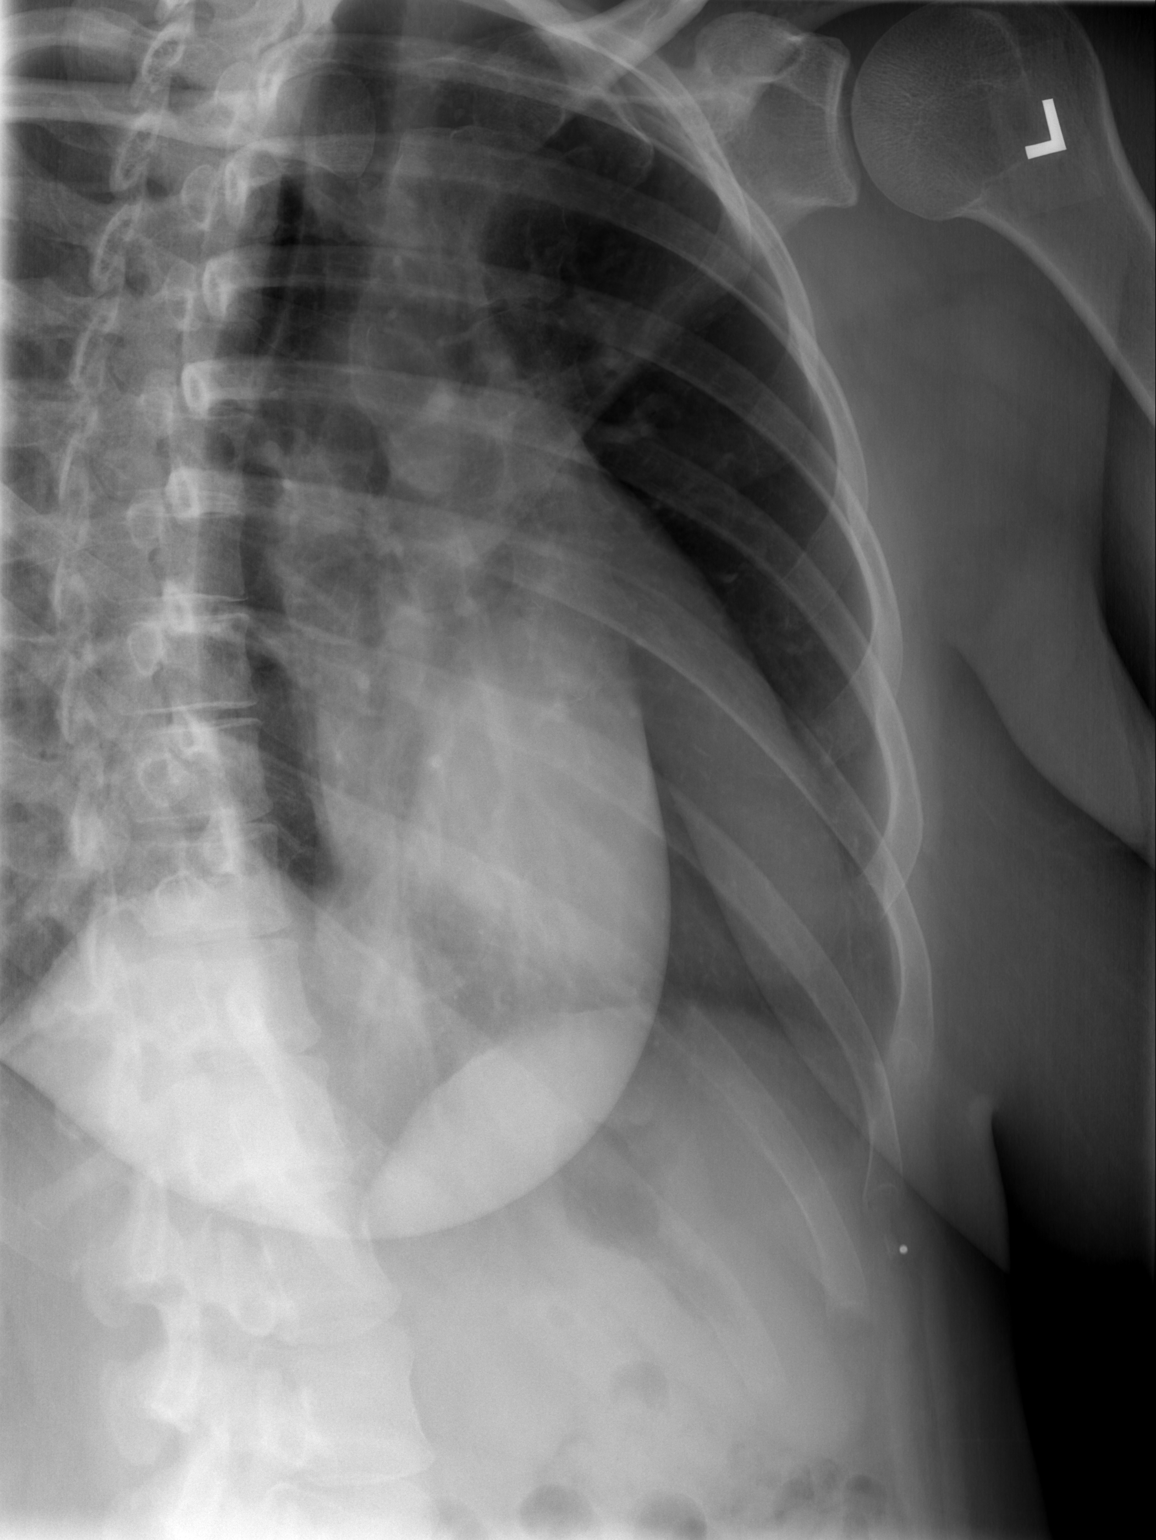

[3 of 3 positions shown; findings below may reference images not displayed]

FINDINGS: No fracture or other bone lesions are seen involving the ribs. There
is no evidence of pneumothorax or pleural effusion. Both lungs are
clear. Heart size and mediastinal contours are within normal limits.
IMPRESSION: Negative radiographs of the chest and left ribs.

## 2024-01-30 ENCOUNTER — Encounter: Payer: Self-pay | Admitting: Obstetrics and Gynecology

## 2024-02-14 ENCOUNTER — Encounter: Payer: PRIVATE HEALTH INSURANCE | Admitting: Neurology

## 2024-03-30 ENCOUNTER — Ambulatory Visit: Payer: PRIVATE HEALTH INSURANCE | Admitting: Neurology

## 2024-04-07 ENCOUNTER — Ambulatory Visit (INDEPENDENT_AMBULATORY_CARE_PROVIDER_SITE_OTHER): Payer: PRIVATE HEALTH INSURANCE | Admitting: Obstetrics and Gynecology

## 2024-04-07 ENCOUNTER — Encounter: Payer: Self-pay | Admitting: Obstetrics and Gynecology

## 2024-04-07 VITALS — BP 104/78 | HR 108 | Temp 98.2°F | Wt 218.0 lb

## 2024-04-07 DIAGNOSIS — N939 Abnormal uterine and vaginal bleeding, unspecified: Secondary | ICD-10-CM | POA: Diagnosis not present

## 2024-04-07 DIAGNOSIS — D259 Leiomyoma of uterus, unspecified: Secondary | ICD-10-CM | POA: Diagnosis not present

## 2024-04-07 DIAGNOSIS — N83202 Unspecified ovarian cyst, left side: Secondary | ICD-10-CM | POA: Diagnosis not present

## 2024-04-07 DIAGNOSIS — N83201 Unspecified ovarian cyst, right side: Secondary | ICD-10-CM

## 2024-04-07 DIAGNOSIS — R102 Pelvic and perineal pain unspecified side: Secondary | ICD-10-CM | POA: Diagnosis not present

## 2024-04-07 DIAGNOSIS — N898 Other specified noninflammatory disorders of vagina: Secondary | ICD-10-CM

## 2024-04-07 LAB — WET PREP FOR TRICH, YEAST, CLUE

## 2024-04-07 NOTE — Progress Notes (Signed)
 "  33 y.o. G0P0 female with AUB, uterine fibroids (including sm), pelvic pain, bilateral ovarian dermoid cysts, Chiari 1 malformation here for f/u of AUB-F and pelvic pain. Single. ENT outpt appt scheduler.  Patient's last menstrual period was 03/25/2024 (approximate). Period Duration (Days): 10 Period Pattern: Regular Menstrual Flow: Moderate Menstrual Control: Maxi pad Dysmenorrhea: (!) Moderate Dysmenorrhea Symptoms: Headache  Patient was seen by Dr. Gustave, [facility] surgeon in [facility]  on 05/25/2022.  Prescribe TXA with plans for an MRI. She has expelled the Mirena due to heavy vaginal bleeding. Tried OCPs and NSAIDs without improvement in bleeding.  On 05/31/2023, she presented for secondary consultation reporting ongoing HVB, bulk symptoms including left lower quadrant pain and occasional dyspareunia. Desires fertility sparing treatments. Started on myfembree  07/05/23, planning to continue for 2 years then proceed with surgical management  03/18/23 Hgb 12.3  03/14/2019 14 cm uterus with 7 cm exophytic posterior fibroid and 5 cm fundal fibroid. 05/14/2023 TVUS: 20 cm uterus, 5.2 cm and 4 cm heterogeneous mass of the left and right ovary, thought to be a fat-containing mass on CT imaging. 05/14/23 CT ab/p: largest fibroid 8.5cm, no definitive count provided    Component Value Date/Time   DIAGPAP  07/05/2023 1048    - Negative for intraepithelial lesion or malignancy (NILM)   HPVHIGH Negative 07/05/2023 1048   ADEQPAP  07/05/2023 1048    Satisfactory for evaluation; transformation zone component ABSENT.  07/05/23 EMB: Benign secretory endometrium  - Negative for hyperplasia or malignancy   07/23/23 Pelvic MRI: Reproductive: Extremely bulky fibroid uterus extending well into the abdomen, overall dimensions 19.9 x 15.6 x 12.8 cm (series 5, image 20, series 3, image 27). Numerous fibroids of varying sizes, several of which are with substantial submucosal components and grossly  distort, but do not completely efface the endometrial cavity. Large index fibroid with a submucosal component in the anterior uterine fundus measures 7.1 x 6.4 x 4.6 cm (series 5, image 21, series 3, image 24). Largest overall fibroid, also with a small submucosal component is situated in the anterior lower uterine segment and measures 8.4 x 6.7 x 6.3 cm (series 5, image 15, series 3, image 36). Multiple small, benign incidental nabothian cysts of the cervix, requiring no specific further follow-up or characterization. Large right ovarian dermoid composed primarily of macroscopic fat situated in the posterior pelvis measuring 8.6 x 6.2 x 6.4 cm (series 5, image 13, series 3, image 40). The left ovary is displaced high into the left hemiabdomen and also contains a large, primarily macroscopic fat containing dermoid measuring 6.8 x 4.7 x 3.6 cm (series 3, image 9, series 5, image 31).    IMPRESSION: 1. Extremely bulky fibroid uterus extending well into the abdomen, overall dimensions 19.9 x 15.6 x 12.8 cm. Numerous fibroids of varying sizes, several of which are with substantial submucosal components. 2. Large right ovarian dermoid composed primarily of macroscopic fat situated in the posterior pelvis measuring 8.6 x 6.2 x 6.4 cm.  3. The left ovary is displaced high into the left hemiabdomen and also contains a large, primarily macroscopic fat containing dermoid measuring 6.8 x 4.7 x 3.6 cm. 4. Small volume free fluid in the low pelvis.  Started myfembree  07/05/23. Today, she reports experiencing spotting for 2 weeks at a time but periods are overall improved. Occasional hot flashes, improving and able to tolerate. LLQ pain resolved! Not SA this year. +vaginal odor since last thursday  Birth control: Abstinent Sexually active: No  GYN HISTORY:  No significant history   OB History  Gravida Para Term Preterm AB Living  0       SAB IAB Ectopic Multiple Live Births          Past Medical History:   Diagnosis Date   Allergy    Anemia    Anxiety 2020   Chiari malformation    Depression    Diverticulitis    Fibroids    Headache(784.0)    Hx of menorrhagia    Hypothyroidism    Thyroid  disease    TMJ (dislocation of temporomandibular joint)    Uterine leiomyoma    Vertigo     Past Surgical History:  Procedure Laterality Date   COLONOSCOPY     WISDOM TOOTH EXTRACTION      Current Outpatient Medications on File Prior to Visit  Medication Sig Dispense Refill   buPROPion (WELLBUTRIN XL) 150 MG 24 hr tablet Take 150 mg by mouth every morning.     CETIRIZINE  HCL CHILDRENS 5 MG/5ML SOLN as needed.     cholecalciferol (VITAMIN D3) 25 MCG (1000 UNIT) tablet Take 1,000 Units by mouth daily. (Patient taking differently: Take 5,000 Units by mouth daily.)     cyanocobalamin (VITAMIN B12) 1000 MCG tablet Take 1,000 mcg by mouth 3 (three) times a week.     cyclobenzaprine  (FLEXERIL ) 5 MG tablet Take 1 tablet (5 mg total) by mouth at bedtime. 30 tablet 3   Ferrous Sulfate (IRON PO) Take by mouth.     levothyroxine (SYNTHROID) 50 MCG tablet Take 50 mcg by mouth daily before breakfast.     Relugolix-Estradiol-Norethind (MYFEMBREE ) 40-1-0.5 MG TABS Take 1 tablet by mouth daily. 28 tablet 11   No current facility-administered medications on file prior to visit.    Allergies  Allergen Reactions   Doxycycline     Stomach pain   Erythromycin     Stomach pain   Nabumetone  Other (See Comments)    Stomach upset   Sertraline Hcl Other (See Comments)    Other reaction(s): Muscle twitches      PE Today's Vitals   04/07/24 1444  BP: 104/78  Pulse: (!) 108  Temp: 98.2 F (36.8 C)  TempSrc: Oral  SpO2: 97%  Weight: 218 lb (98.9 kg)   Body mass index is 36.28 kg/m.  Physical Exam Vitals reviewed. Exam conducted with a chaperone present.  Constitutional:      General: She is not in acute distress.    Appearance: Normal appearance.  HENT:     Head: Normocephalic and atraumatic.      Nose: Nose normal.  Eyes:     Extraocular Movements: Extraocular movements intact.     Conjunctiva/sclera: Conjunctivae normal.  Pulmonary:     Effort: Pulmonary effort is normal.  Abdominal:     Comments: uterus palpated at umbilicus along left side, fullness of pelvis noted  Genitourinary:    General: Normal vulva.     Exam position: Lithotomy position.     Vagina: Normal. No vaginal discharge.     Cervix: Normal. No cervical motion tenderness, discharge or lesion.     Uterus: Normal. Not enlarged and not tender.      Adnexa: Right adnexa normal and left adnexa normal.     Comments: Cervix shifted to the left Musculoskeletal:        General: Normal range of motion.     Cervical back: Normal range of motion.  Neurological:     General: No focal deficit present.  Mental Status: She is alert.  Psychiatric:        Mood and Affect: Mood normal.        Behavior: Behavior normal.     Assessment and Plan:        Abnormal uterine bleeding (AUB) Assessment & Plan: Symptomatic fibroids with bulk sx and AUB. Submucoasl fibroids noted. 07/23/23 Pelvic MRI: 20cm multifibroid uterus. Uterus also significantly enlarged since 2023.  Ultimately desires fertility sparing surgical management in the form of myomectomy- will abdominal and hysteroscopic approach, however her bleeding is well controlled with Myfembree , which she can continue for up to 24 months- March 2027.  Will continue at this time All questions answered   Uterine leiomyoma, unspecified location Assessment & Plan: As above   Pelvic pain Assessment & Plan: Improved with myfembree    Bilateral ovarian cysts Assessment & Plan: Bilateral dermoid cysts, 7cm and 9cm, present since 2024- overall stable on imaging in 2025 Reviewed small chance of malignancy and torsion Would recommend removal at the time of myomectomy Continue surveillance this time   Vaginal discharge -     WET PREP FOR TRICH, YEAST,  CLUE   25 mins  total time was spent for this patient encounter, including preparation, face-to-face counseling with the patient, coordination of care, and documentation of the encounter.   Vera LULLA Pa, MD  "

## 2024-04-07 NOTE — Assessment & Plan Note (Signed)
"   As above         "

## 2024-04-07 NOTE — Assessment & Plan Note (Signed)
 Improved with myfembree 

## 2024-04-07 NOTE — Assessment & Plan Note (Signed)
 Bilateral dermoid cysts, 7cm and 9cm, present since 2024- overall stable on imaging in 2025 Reviewed small chance of malignancy and torsion Would recommend removal at the time of myomectomy Continue surveillance this time

## 2024-04-07 NOTE — Assessment & Plan Note (Addendum)
 Symptomatic fibroids with bulk sx and AUB. Submucoasl fibroids noted. 07/23/23 Pelvic MRI: 20cm multifibroid uterus. Uterus also significantly enlarged since 2023.  Ultimately desires fertility sparing surgical management in the form of myomectomy- will abdominal and hysteroscopic approach, however her bleeding is well controlled with Myfembree , which she can continue for up to 24 months- March 2027.  Will continue at this time All questions answered

## 2024-07-06 ENCOUNTER — Ambulatory Visit: Payer: PRIVATE HEALTH INSURANCE | Admitting: Obstetrics and Gynecology
